# Patient Record
Sex: Female | Born: 1950 | Race: White | Hispanic: No | Smoking: Never smoker
Health system: Southern US, Community
[De-identification: ages and names within clinical notes are randomized; demographics above are authoritative.]

## PROBLEM LIST (undated history)

## (undated) DIAGNOSIS — J189 Pneumonia, unspecified organism: Secondary | ICD-10-CM

## (undated) DIAGNOSIS — I10 Essential (primary) hypertension: Secondary | ICD-10-CM

## (undated) DIAGNOSIS — N393 Stress incontinence (female) (male): Secondary | ICD-10-CM

## (undated) DIAGNOSIS — F32A Depression, unspecified: Secondary | ICD-10-CM

## (undated) DIAGNOSIS — F988 Other specified behavioral and emotional disorders with onset usually occurring in childhood and adolescence: Secondary | ICD-10-CM

## (undated) DIAGNOSIS — R7303 Prediabetes: Secondary | ICD-10-CM

## (undated) DIAGNOSIS — M1990 Unspecified osteoarthritis, unspecified site: Secondary | ICD-10-CM

## (undated) DIAGNOSIS — R55 Syncope and collapse: Principal | ICD-10-CM

## (undated) DIAGNOSIS — N200 Calculus of kidney: Secondary | ICD-10-CM

## (undated) DIAGNOSIS — F329 Major depressive disorder, single episode, unspecified: Secondary | ICD-10-CM

## (undated) DIAGNOSIS — F419 Anxiety disorder, unspecified: Secondary | ICD-10-CM

## (undated) DIAGNOSIS — C539 Malignant neoplasm of cervix uteri, unspecified: Secondary | ICD-10-CM

## (undated) DIAGNOSIS — K219 Gastro-esophageal reflux disease without esophagitis: Secondary | ICD-10-CM

## (undated) DIAGNOSIS — Z889 Allergy status to unspecified drugs, medicaments and biological substances status: Secondary | ICD-10-CM

## (undated) HISTORY — DX: Syncope and collapse: R55

---

## 1989-01-19 DIAGNOSIS — J189 Pneumonia, unspecified organism: Secondary | ICD-10-CM

## 1989-01-19 HISTORY — PX: BREAST BIOPSY: SHX20

## 1989-01-19 HISTORY — DX: Pneumonia, unspecified organism: J18.9

## 1993-05-21 DIAGNOSIS — C539 Malignant neoplasm of cervix uteri, unspecified: Secondary | ICD-10-CM

## 1993-05-21 HISTORY — PX: VAGINAL HYSTERECTOMY: SUR661

## 1993-05-21 HISTORY — DX: Malignant neoplasm of cervix uteri, unspecified: C53.9

## 2000-04-03 ENCOUNTER — Encounter: Payer: Self-pay | Admitting: Gynecology

## 2000-04-03 ENCOUNTER — Encounter: Admission: RE | Admit: 2000-04-03 | Discharge: 2000-04-03 | Payer: Self-pay | Admitting: Gynecology

## 2000-04-18 ENCOUNTER — Encounter: Payer: Self-pay | Admitting: Gynecology

## 2000-04-24 ENCOUNTER — Inpatient Hospital Stay (HOSPITAL_COMMUNITY): Admission: RE | Admit: 2000-04-24 | Discharge: 2000-04-26 | Payer: Self-pay | Admitting: Gynecology

## 2000-07-15 ENCOUNTER — Other Ambulatory Visit: Admission: RE | Admit: 2000-07-15 | Discharge: 2000-07-15 | Payer: Self-pay | Admitting: Gynecology

## 2001-01-07 ENCOUNTER — Other Ambulatory Visit: Admission: RE | Admit: 2001-01-07 | Discharge: 2001-01-07 | Payer: Self-pay | Admitting: Gynecology

## 2001-10-23 ENCOUNTER — Emergency Department (HOSPITAL_COMMUNITY): Admission: EM | Admit: 2001-10-23 | Discharge: 2001-10-23 | Payer: Self-pay | Admitting: Emergency Medicine

## 2001-10-23 ENCOUNTER — Encounter: Payer: Self-pay | Admitting: Emergency Medicine

## 2002-12-02 ENCOUNTER — Other Ambulatory Visit: Admission: RE | Admit: 2002-12-02 | Discharge: 2002-12-02 | Payer: Self-pay | Admitting: Gynecology

## 2004-03-15 ENCOUNTER — Other Ambulatory Visit: Admission: RE | Admit: 2004-03-15 | Discharge: 2004-03-15 | Payer: Self-pay | Admitting: Gynecology

## 2004-04-12 ENCOUNTER — Encounter: Admission: RE | Admit: 2004-04-12 | Discharge: 2004-04-12 | Payer: Self-pay | Admitting: Gynecology

## 2007-10-27 ENCOUNTER — Other Ambulatory Visit: Admission: RE | Admit: 2007-10-27 | Discharge: 2007-10-27 | Payer: Self-pay | Admitting: Internal Medicine

## 2009-05-17 ENCOUNTER — Ambulatory Visit (HOSPITAL_COMMUNITY): Admission: RE | Admit: 2009-05-17 | Discharge: 2009-05-17 | Payer: Self-pay | Admitting: Internal Medicine

## 2010-10-06 NOTE — Discharge Summary (Signed)
[facility]  Patient:    Karina [name], Karina [name]                     MRN: [id number] Adm. Date:  04/24/00 Disc. Date: 04/26/00 Attending:  Gretta Cool, M.D. CC:         [facility]   Discharge Summary  HISTORY OF PRESENT ILLNESS:  Ms. Gomm is a 60 year old white married female, gravida 3, para 2, abortus 1, who presents to the office with reports of increasingly severe urinary incontinence with genuine stress pattern and minimal urgency component.  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  On exam she has anterior vaginal wall descensus and loss of level 2 support with paravaginal defects bilaterally as well as rotational descent of the vesicle neck with incontinence demonstrated in the supine position with coughing.  She has voided approximately 400 cc of urine with residual 15 cc with a positive Q-tip test of greater than 60 degrees. The anal reflexes and sphincter are intact but poor posterior support with a large rectocele and enterocele.  She is extremely symptomatic with pain on prolonged standing and is now admitted for definitive therapy by paravaginal repair plus Burch, posterior enterocele repairs.  PHYSICAL EXAMINATION:  CHEST:  Clear to A&P.  HEART:  Rate and rhythm are regular without murmur, gallop, or cardiac enlargement.  ABDOMEN:  Soft with large panniculus with very high abdomen to hip ratio.  No evidence of organomegaly.  Previous cesarean section scar for second delivery.  PELVIC:  External genitalia within normal limits for female.  Vagina clean and rugose.  Bilateral paravaginal defects with a large cystocele, as noted. There is marked rotational descent of the vesicle neck with 30 degrees positive Q-tip test.   She has a large enterocele with transverse fascial defect at the apex of the cuff.  There is a large rectocele with detachment of the peroneal body from the perirectal fascia.  Cervix and uterus are surgically absent.  The cuff seems very well supported and does not descend with straining.  Adnexa nonpalpable.  Rectovaginal exam confirms.  IMPRESSION: 1. Grade 3 cystocele with bilateral paravaginal defects and rotational descent    of the vesicle neck with incontinence requiring pads. 2. Rectocele and enterocele, symptomatic, with pelvic pressure on standing,    grade 3. 3. History of cold knife conization and vaginal hysterectomy for microinvasive    squamous cell carcinoma of the cervix without recurrent disease. 4. Hypertension, controlled on angiotensin-converting enzyme inhibitors. 5. Obesity. 6. Family history of diabetes. 7. History of breast cancer.  PLAN:  She is now admitted for definitive therapy by paravaginal repair plus Burch procedures, posterior and enterocele repairs under general anesthesia. The risks and benefits have been discussed with the patient, and she accepts these procedures.  LABORATORY DATA:  Admission hemoglobin 14.4, hematocrit 41.6.  BUN 13, creatinine 0.7.  No evidence of acute disease on chest x-ray.  EKG:  Normal sinus rhythm.  HOSPITAL COURSE:  The patient underwent the above-named procedures without complications.  She was returned to the recovery room in excellent condition. Estimated blood loss was less than 300 cc, no replacement required.  Her postoperative course was without complications, and she was discharged on the second postoperative day in excellent condition.  DISCHARGE INSTRUCTIONS:  No heavy  lifting or straining.  No vaginal entrance. Increased ambulation as tolerated.  She is to call for any fever of over 100.1 or failure of daily improvement.  Diet is regular.  MEDICATIONS: 1. Tylox 1 p.o. q.4h. p.r.n. discomfort. 2.  Vioxx 25 mg 1 p.o. q.d.  Suprapubic catheter was removed prior to discharge.  FOLLOW-UP:  She is to return to the office in one week for follow-up.  CONDITION ON DISCHARGE:  Excellent.  DISCHARGE DIAGNOSES: 1. Genuine stress incontinence with grade 3 cystocele and bilateral    paravaginal defects. 2. Symptomatic enterocele and rectocele grade 3. 3. History of cold knife conization and vaginal hysterectomy for microinvasive    carcinoma of the cervix in 1995.  PROCEDURES:  Paravaginal repair plus Burch procedure for stress incontinence, cystocele, rectocele, and enterocele repairs under general anesthesia. DD:  05/20/00 TD:  05/20/00 Job: [id number]

## 2010-10-06 NOTE — H&P (Signed)
[facility]  Patient:    Karina [name], Karina [name]                     MRN: [id number] Attending:  Gretta Cool, M.D. CC:         [facility]   History and Physical  CHIEF COMPLAINT:  Incontinence of urine and symptomatic pelvic organ prolapse.  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  She has had increasingly severe incontinence of urine with genuine stress incontinence pattern and minimal urgency component.  On exam, she has had increasingly severe anterior vaginal wall descensus and loss of level 2 support with paravaginal defects bilaterally; she also has rotational descent of the vesicle neck with incontinence demonstrated in supine position with coughing, two out of three successive coughs.  She has a voided volume of 400 cc, residual urine of 15 cc, positive Q-tip test with rotation of greater than 60 degrees.  She has intact anal reflexes and sphincter, but she has very poor posterior support with large enterocele and rectocele.  She is extremely symptomatic with pain on prolonged standing.  She is now admitted for definitive therapy by paravaginal repair plus Burch, posterior and enterocele repairs.  She understands the alternative forms of therapy including sling procedure and conservative management, which she has tried and failed.  She also understands the possible prolonged return to comfortable coitus and the need for vaginal estrogen to maintain her fascial integrity as she passes through menopause.  She is now admitted for definitive therapy.  PAST MEDICAL HISTORY:  Usual childhood diseases without sequelae.  Medical illnesses:  Hypertension, on therapy with Lotensin  20/25 mg, a half tablet daily; Estrace vaginal cream; Nasonex inhaler for allergies.  ALLERGIES:  She is allergic to PENICILLIN with rash, remote.  No other known significant allergy.  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  FAMILY HISTORY:  Father is living with diabetes, adult-onset type 2.  Mother is living and well.  Two maternal aunts had breast disease.  Maternal grandmother died of breast cancer.  SOCIAL HISTORY:  Patient is a Museum/gallery curator, recently remarried.  Two children, one living at home.  REVIEW OF SYSTEMS:  HEENT:  Denies symptoms.  CARDIORESPIRATORY:  Denies asthma, cough, bronchitis or shortness of breath.  GI/GU:  Denies frequency, urgency, dysuria.  Incontinence as above with genuine stress.  PHYSICAL EXAMINATION  GENERAL:  Well-developed but rather significantly over ideal weight at 5 feet 1 inch in height and 184 pounds.  HEENT:  Pupils equal and react to light and accommodation.  Fundi benign. Oropharynx clear.  NECK:  Supple without mass or thyroid enlargement.  CHEST:  Clear P to A.  HEART:  Regular rhythm, without murmur or cardiac enlargement.  ABDOMEN:  Soft with large panniculus with very high abdomen-to-hip ratio.  No evidence of organomegaly.  Previous cesarean section scar for second delivery.  PELVIC:  External genitalia:  Normal female.  Vagina:  Adequate estrogen effect.  There are bilateral paravaginal defects with large cystocele.  There is also marked rotational descent of the vesical neck with a 30 degree positive Q-tip test.  I am unable to identify a significant central  fascial defect though even with reduction of the paravaginal defects.  She has a very large enterocele with transverse fascial defect at the apex of the cuff.  She also has a large rectocele with detachment of the perineal body from the perirectal  fascia.  Cervix and uterus are surgically absent.  The cuff seems very well-supported and does not descend with straining.  Adnexa nonpalpable. Rectovaginal exam confirms.  EXTREMITIES:  Negative.  NEUROLOGIC:  Physiologic.  IMPRESSIONS 1. Grade 3 cystocele with bilateral paravaginal defects and rotational descent    of the vesical neck with incontinence requiring pads. 2. Rectocele and enterocele, symptomatic with pelvic pressure on standing,    grade 3. 3. History of cold knife conization and vaginal hysterectomy for microinvasive    squamous cell carcinoma of the cervix with no recurrent disease. 4. Hypertension, controlled on ACE inhibitors. 5. Obesity. 6. Family history of diabetes, first degree relatives. 7. Family history of breast cancer, second degree relatives. DD:  04/24/00 TD:  04/25/00 Job: [id number]

## 2010-10-06 NOTE — Op Note (Signed)
[facility]  Patient:    Karina [name], Karina [name]               MRN: [id number] Proc. Date: 04/24/00 Adm. Date:  [id number] Attending:  Katrina Stack CC:         [facility]   Operative Report  PREOPERATIVE DIAGNOSES: 1. Genuine stress incontinence with a grade 3 cystocele and bilateral    paravaginal defects. 2. Symptomatic enterocele and rectocele, grade 3. 3. History of cold knife conization and vaginal hysterectomy for    microinvasive carcinoma of the cervix, 1995.  POSTOPERATIVE DIAGNOSIS: 1. Genuine stress incontinence with a grade 3 cystocele and bilateral    paravaginal defects. 2. Symptomatic enterocele and rectocele, grade 3. 3. History of cold knife conization and vaginal hysterectomy for    microinvasive carcinoma of the cervix, 1995.  OPERATION:  Paravaginal repair plus Burch procedure for stress incontinence and cystocele, rectocele, and enterocele repairs.  SURGEON:  Gretta Cool, M.D.  ASSISTANT:  Raynald Kemp, M.D.  ANESTHESIA:  General endotracheal.  DESCRIPTION OF PROCEDURE:  Under excellent anesthesia, with patient prepped and draped in Allen stirrups, with her bladder drained by a three-way Foley catheter, a Pfannenstiel incision was made and extended through the fascia. The rectus muscles were then separated in the midline and attention turned to the rectopubic space.  The retropubic space was dissected and the fatty adventitial tissue removed from the paravaginal fascia.  The landmarks were identified and the paravaginal repair undertaken beginning at the apex just below the attachment of the white line fascia of the pelvis to the ischial spine.  The paravaginal defect was then secured and a series of approximately six sutures approximately a centimeter apart on each side from the centimeter below the ischial spine to the symphysis.  Once the paravaginal sutures were placed, further using  interrupted sutures of 0 Ethibond sutures were placed for the Burch procedure as far lateral as possible so as to support the vesicle neck into a high intraabdominal position.  Ethibond O was again used with two sutures placed on each side.  The procedure was exceedingly difficult because of this patients obesity and the depth of the paravaginal tissues. Bleeding was well controlled and at this point the retropubic space was irrigated and the closure of the abdominal wall undertaken.  The rectus muscles were plicated in the midline with a running suture of 0 Monocryl.  The fascia was then approximated with a running suture of 0 Vicryl from each angle of the midline.  The subcutaneous tissue was approximated with interrupted sutures, 3-0 Vicryl and the skin closed with skin staples and Steri-Strips.  Attention was turned to the vaginal portion of the procedure.  The patient was repositioned and the vaginal procedure begun by incision of the mucosa at the perineal body.  The mucosa was then undermined from the introitus to the apex of vaginal cuff.  The paravaginal tissues were injected prior to dissection with Xylocaine 1% with epinephrine to improve tissue planes and conserve blood loss.  At the apex of the dissection of the cuff, the very large enterocele could be seen bulging through the fascial defect.  The large transverse fascial defect was identified extending from the one angle to the other of the apex of the vaginal cuff with a large 6 cm bulge of fatty tissue through the defect.  The enterocele sac was removed and plicated and the enterocele entirely reduced.  The uterosacral ligaments were then identified  on each side and plication of the uterosacral cardinal complex undertaken with 0 Ethibond as far posteriorly as possible so as to secure the apex of the cuff.  The fascial defect that had been identified was then secured to the uterosacral cardinal complex plication and to the  apex of the vaginal cuff fascia.  A running suture of 0 Vicryl was used.  The upper layers of endopelvic fascia were then plicated with a running suture of 0 Vicryl from the apex of the cuff to the introitus.  Great care was taken to plicate the levator fascia and to reattach the perineal body fascia to the levator fascia.  At this point the mucosa was trimmed and the vaginal mucosa closed with a running subcuticular closure of 2-0 Vicryl including the upper layers of endopelvic fascia.  At the end of the procedure, the vaginal remained adequate by digital caliber.  The The vaginal length was well maintained.  At this point the bleeding was well controlled.  The bladder was filled with approximately 400 cc of irrigation fluid and Bonnano suprapubic Cystocath placed.  The [place] suprapubic Cystocath was then secured with interrupted sutures of 3-0 nylon.  At the end of the procedure, sponge and lap counts were correct.  There were no complications.  Estimated blood loss is less than 300 cc.  Replacement, none. DD:  04/24/00 TD:  04/25/00 Job: [id number]

## 2011-03-28 ENCOUNTER — Other Ambulatory Visit (HOSPITAL_COMMUNITY): Payer: Self-pay | Admitting: Physician Assistant

## 2011-03-28 DIAGNOSIS — Z139 Encounter for screening, unspecified: Secondary | ICD-10-CM

## 2011-04-02 ENCOUNTER — Other Ambulatory Visit: Payer: Self-pay | Admitting: Obstetrics and Gynecology

## 2011-04-02 DIAGNOSIS — Z139 Encounter for screening, unspecified: Secondary | ICD-10-CM

## 2011-04-03 ENCOUNTER — Ambulatory Visit (HOSPITAL_COMMUNITY)
Admission: RE | Admit: 2011-04-03 | Discharge: 2011-04-03 | Disposition: A | Discharge status: Home / Self Care | Payer: Self-pay | Source: Ambulatory Visit | Attending: Physician Assistant | Admitting: Physician Assistant

## 2011-04-03 DIAGNOSIS — Z139 Encounter for screening, unspecified: Secondary | ICD-10-CM

## 2011-09-19 DIAGNOSIS — R55 Syncope and collapse: Secondary | ICD-10-CM

## 2011-09-19 HISTORY — DX: Syncope and collapse: R55

## 2011-10-04 ENCOUNTER — Emergency Department (HOSPITAL_COMMUNITY): Payer: No Typology Code available for payment source

## 2011-10-04 ENCOUNTER — Encounter (HOSPITAL_COMMUNITY): Payer: Self-pay | Admitting: *Deleted

## 2011-10-04 ENCOUNTER — Observation Stay (HOSPITAL_COMMUNITY)
Admission: EM | Admit: 2011-10-04 | Discharge: 2011-10-05 | Disposition: A | Discharge status: Home / Self Care | Payer: No Typology Code available for payment source | Source: Ambulatory Visit | Attending: Cardiology | Admitting: Cardiology

## 2011-10-04 DIAGNOSIS — R55 Syncope and collapse: Principal | ICD-10-CM | POA: Diagnosis present

## 2011-10-04 DIAGNOSIS — R0989 Other specified symptoms and signs involving the circulatory and respiratory systems: Secondary | ICD-10-CM | POA: Insufficient documentation

## 2011-10-04 DIAGNOSIS — F3289 Other specified depressive episodes: Secondary | ICD-10-CM | POA: Insufficient documentation

## 2011-10-04 DIAGNOSIS — N393 Stress incontinence (female) (male): Secondary | ICD-10-CM

## 2011-10-04 DIAGNOSIS — R079 Chest pain, unspecified: Secondary | ICD-10-CM | POA: Insufficient documentation

## 2011-10-04 DIAGNOSIS — R0609 Other forms of dyspnea: Secondary | ICD-10-CM | POA: Insufficient documentation

## 2011-10-04 DIAGNOSIS — I10 Essential (primary) hypertension: Secondary | ICD-10-CM | POA: Diagnosis present

## 2011-10-04 DIAGNOSIS — Z8541 Personal history of malignant neoplasm of cervix uteri: Secondary | ICD-10-CM | POA: Insufficient documentation

## 2011-10-04 DIAGNOSIS — F32A Depression, unspecified: Secondary | ICD-10-CM | POA: Diagnosis present

## 2011-10-04 DIAGNOSIS — R42 Dizziness and giddiness: Secondary | ICD-10-CM | POA: Insufficient documentation

## 2011-10-04 DIAGNOSIS — Y929 Unspecified place or not applicable: Secondary | ICD-10-CM | POA: Insufficient documentation

## 2011-10-04 DIAGNOSIS — F329 Major depressive disorder, single episode, unspecified: Secondary | ICD-10-CM | POA: Diagnosis present

## 2011-10-04 HISTORY — DX: Essential (primary) hypertension: I10

## 2011-10-04 HISTORY — DX: Major depressive disorder, single episode, unspecified: F32.9

## 2011-10-04 HISTORY — DX: Pneumonia, unspecified organism: J18.9

## 2011-10-04 HISTORY — DX: Stress incontinence (female) (male): N39.3

## 2011-10-04 HISTORY — DX: Other specified behavioral and emotional disorders with onset usually occurring in childhood and adolescence: F98.8

## 2011-10-04 HISTORY — DX: Allergy status to unspecified drugs, medicaments and biological substances: Z88.9

## 2011-10-04 HISTORY — DX: Unspecified osteoarthritis, unspecified site: M19.90

## 2011-10-04 HISTORY — DX: Depression, unspecified: F32.A

## 2011-10-04 HISTORY — DX: Prediabetes: R73.03

## 2011-10-04 HISTORY — DX: Calculus of kidney: N20.0

## 2011-10-04 HISTORY — DX: Gastro-esophageal reflux disease without esophagitis: K21.9

## 2011-10-04 HISTORY — DX: Malignant neoplasm of cervix uteri, unspecified: C53.9

## 2011-10-04 HISTORY — DX: Anxiety disorder, unspecified: F41.9

## 2011-10-04 HISTORY — DX: Syncope and collapse: R55

## 2011-10-04 LAB — COMPREHENSIVE METABOLIC PANEL
ALT: 28 U/L (ref 0–35)
Albumin: 3.9 g/dL (ref 3.5–5.2)
Alkaline Phosphatase: 81 U/L (ref 39–117)
BUN: 13 mg/dL (ref 6–23)
Calcium: 9.6 mg/dL (ref 8.4–10.5)
Potassium: 3.8 mEq/L (ref 3.5–5.1)
Sodium: 141 mEq/L (ref 135–145)
Total Protein: 7 g/dL (ref 6.0–8.3)

## 2011-10-04 LAB — DIFFERENTIAL
Basophils Relative: 0 % (ref 0–1)
Eosinophils Absolute: 0.2 10*3/uL (ref 0.0–0.7)
Eosinophils Relative: 2 % (ref 0–5)
Neutrophils Relative %: 64 % (ref 43–77)

## 2011-10-04 LAB — URINE MICROSCOPIC-ADD ON

## 2011-10-04 LAB — URINALYSIS, ROUTINE W REFLEX MICROSCOPIC
Bilirubin Urine: NEGATIVE
Nitrite: NEGATIVE
Specific Gravity, Urine: 1.018 (ref 1.005–1.030)
Urobilinogen, UA: 0.2 mg/dL (ref 0.0–1.0)
pH: 6.5 (ref 5.0–8.0)

## 2011-10-04 LAB — CBC
MCH: 30.6 pg (ref 26.0–34.0)
MCHC: 33.9 g/dL (ref 30.0–36.0)
MCV: 90.1 fL (ref 78.0–100.0)
Platelets: 221 10*3/uL (ref 150–400)

## 2011-10-04 LAB — TROPONIN I: Troponin I: <0.3 ng/mL (ref <0.30)

## 2011-10-04 LAB — CARDIAC PANEL(CRET KIN+CKTOT+MB+TROPI)
CK, MB: 3.8 ng/mL (ref 0.3–4.0)
Total CK: 144 U/L (ref 7–177)

## 2011-10-04 MED ORDER — LISINOPRIL 20 MG PO TABS
20.0000 mg | ORAL_TABLET | Freq: Every day | ORAL | Status: DC
  Administered 2011-10-05: 20 mg via ORAL
  Filled 2011-10-04 (×2): qty 1

## 2011-10-04 MED ORDER — ADULT MULTIVITAMIN W/MINERALS CH
1.0000 | ORAL_TABLET | Freq: Every day | ORAL | Status: DC
  Administered 2011-10-05: 1 via ORAL
  Filled 2011-10-04: qty 1

## 2011-10-04 MED ORDER — ACETAMINOPHEN 650 MG RE SUPP: 650.0000 mg | Freq: Four times a day (QID) | RECTAL | Status: DC | PRN

## 2011-10-04 MED ORDER — ONDANSETRON HCL 4 MG/2ML IJ SOLN: 4.0000 mg | Freq: Four times a day (QID) | INTRAMUSCULAR | Status: DC | PRN

## 2011-10-04 MED ORDER — ACETAMINOPHEN 325 MG PO TABS: 650.0000 mg | ORAL_TABLET | Freq: Four times a day (QID) | ORAL | Status: DC | PRN

## 2011-10-04 MED ORDER — ENOXAPARIN SODIUM 40 MG/0.4ML ~~LOC~~ SOLN
40.0000 mg | SUBCUTANEOUS | Status: DC
  Administered 2011-10-04: 40 mg via SUBCUTANEOUS
  Filled 2011-10-04 (×2): qty 0.4

## 2011-10-04 MED ORDER — SODIUM CHLORIDE 0.9 % IV SOLN
INTRAVENOUS | Status: AC
  Administered 2011-10-04: via INTRAVENOUS

## 2011-10-04 MED ORDER — ONDANSETRON HCL 4 MG PO TABS: 4.0000 mg | ORAL_TABLET | Freq: Four times a day (QID) | ORAL | Status: DC | PRN

## 2011-10-04 MED ORDER — SODIUM CHLORIDE 0.9 % IJ SOLN
3.0000 mL | Freq: Two times a day (BID) | INTRAMUSCULAR | Status: DC
  Administered 2011-10-04: 3 mL via INTRAVENOUS

## 2011-10-04 MED ORDER — CITALOPRAM HYDROBROMIDE 20 MG PO TABS
20.0000 mg | ORAL_TABLET | Freq: Every day | ORAL | Status: DC
  Administered 2011-10-05: 20 mg via ORAL
  Filled 2011-10-04: qty 1

## 2011-10-04 MED ORDER — LISINOPRIL-HYDROCHLOROTHIAZIDE 20-25 MG PO TABS: 1.0000 | ORAL_TABLET | Freq: Every day | ORAL | Status: DC

## 2011-10-04 MED ORDER — HYDROCHLOROTHIAZIDE 25 MG PO TABS
25.0000 mg | ORAL_TABLET | Freq: Every day | ORAL | Status: DC
  Administered 2011-10-05: 25 mg via ORAL
  Filled 2011-10-04: qty 1

## 2011-10-04 MED ORDER — METOPROLOL TARTRATE 50 MG PO TABS
50.0000 mg | ORAL_TABLET | Freq: Two times a day (BID) | ORAL | Status: DC
  Administered 2011-10-04 – 2011-10-05 (×2): 50 mg via ORAL
  Filled 2011-10-04 (×3): qty 1

## 2011-10-04 MED ORDER — ASPIRIN EC 81 MG PO TBEC
81.0000 mg | DELAYED_RELEASE_TABLET | Freq: Every day | ORAL | Status: DC
  Administered 2011-10-05: 81 mg via ORAL
  Filled 2011-10-04: qty 1

## 2011-10-04 MED ORDER — OMEGA-3-ACID ETHYL ESTERS 1 G PO CAPS
2.0000 g | ORAL_CAPSULE | Freq: Two times a day (BID) | ORAL | Status: DC
  Administered 2011-10-04 – 2011-10-05 (×2): 2 g via ORAL
  Filled 2011-10-04 (×3): qty 2
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
## 2011-10-04 NOTE — Progress Notes (Signed)
Spoke with [facility] PA concerning needing neuro checks on pt. She stated Dr. Patty Sermons was ruling out cardiac, no need for neuro workup
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
## 2011-10-04 NOTE — ED Notes (Signed)
Difficulty without ambulation to restroom

## 2011-10-04 NOTE — ED Notes (Signed)
Lab at bedside

## 2011-10-04 NOTE — ED Notes (Signed)
Admitting at bedside 
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
## 2011-10-04 NOTE — Progress Notes (Signed)
CRITICAL VALUE ALERT  Critical value received:  CKMB 7.5  Date of notification:  10/04/11  Time of notification:  2254  Critical value read back:yes  Nurse who received alert:  Harless Litten, RN  MD notified (1st page):  [place] on-call (Dr. Tresa Endo)  Time of first page:  2255  Responding MD:  Dr. Tresa Endo  Time MD responded:  2258

## 2011-10-04 NOTE — ED Notes (Signed)
Attempted report x 1, will call back in 5 

## 2011-10-05 ENCOUNTER — Encounter (HOSPITAL_COMMUNITY): Payer: Self-pay | Admitting: Nurse Practitioner

## 2011-10-05 ENCOUNTER — Encounter (INDEPENDENT_AMBULATORY_CARE_PROVIDER_SITE_OTHER): Payer: Self-pay

## 2011-10-05 DIAGNOSIS — R55 Syncope and collapse: Secondary | ICD-10-CM

## 2011-10-05 LAB — LIPID PANEL
LDL Cholesterol: 99 mg/dL (ref 0–99)
Triglycerides: 189 mg/dL — ABNORMAL HIGH (ref <150)
VLDL: 38 mg/dL (ref 0–40)

## 2011-10-05 LAB — TSH: TSH: 1.101 u[IU]/mL (ref 0.350–4.500)

## 2011-10-05 LAB — CARDIAC PANEL(CRET KIN+CKTOT+MB+TROPI)
CK, MB: 9.5 ng/mL (ref 0.3–4.0)
Relative Index: 3.4 — ABNORMAL HIGH (ref 0.0–2.5)
Total CK: 279 U/L — ABNORMAL HIGH (ref 7–177)
Troponin I: <0.3 ng/mL (ref <0.30)

## 2011-10-05 LAB — BASIC METABOLIC PANEL
Chloride: 100 mEq/L (ref 96–112)
Creatinine, Ser: 0.75 mg/dL (ref 0.50–1.10)
GFR calc Af Amer: >90 mL/min (ref >90)
Potassium: 3.6 mEq/L (ref 3.5–5.1)

## 2011-10-05 LAB — HEMOGLOBIN A1C: Hgb A1c MFr Bld: 6.7 % — ABNORMAL HIGH (ref <5.7)

## 2011-10-05 MED ORDER — LISINOPRIL-HYDROCHLOROTHIAZIDE 20-12.5 MG PO TABS: 2.0000 | ORAL_TABLET | Freq: Every day | ORAL | Status: DC
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
## 2011-10-05 NOTE — Discharge Summary (Signed)
See progress notes Karina [name]  

## 2011-10-05 NOTE — Clinical Social Work Psychosocial (Signed)
     Clinical Social Work Department BRIEF PSYCHOSOCIAL ASSESSMENT 10/05/2011  Patient:  Karina [name], Karina [name]     Account Number:  [id number]     Admit date:  10/04/2011  Clinical Social Worker:  Doree Albee  Date/Time:  10/05/2011 10:57 AM  Referred by:  RN  Date Referred:  10/05/2011 Referred for  Advanced Directives   Other Referral:   Interview type:  Patient Other interview type:    PSYCHOSOCIAL DATA Living Status:  FAMILY Admitted from facility:   Level of care:   Primary support name:  Jeralyn Ruths Primary support relationship to patient:  CHILD, ADULT Degree of support available:   strong    CURRENT CONCERNS Current Concerns  Other - See comment   Other Concerns:   advanced directives    SOCIAL WORK ASSESSMENT / PLAN CSW met with pt at bedside to discuss advanced directives. Pt mother was present for discussion, as permitted by pt. Pt and CSW discussed the role of advanced directives with living will and health care power of attorney. Pt is interested in completing in the hosptial. CSW will notify notary to assist with completion of patient advanced directive when pt is ready. Pt plans to appointment pt daughter as [place].   Assessment/plan status:  Psychosocial Support/Ongoing Assessment of Needs Other assessment/ plan:   continue to assist with advanced directive   Information/referral to community resources:   advanced directive    PATIENTS/FAMILYS RESPONSE TO PLAN OF CARE: pt appreciated csw concern and support. Pt is motivated to complete advanced directive to assit with pt needs if ever unable to communicate needs for pt self.

## 2011-10-05 NOTE — Discharge Instructions (Signed)
***  PLEASE REMEMBER TO BRING ALL OF YOUR MEDICATIONS TO EACH OF YOUR FOLLOW-UP OFFICE VISITS.  **NO DRIVING FOR AT LEAST 3 MONTHS OR UNTIL INSTRUCTED OTHERWISE**

## 2011-10-05 NOTE — Progress Notes (Signed)
  Echocardiogram 2D Echocardiogram has been performed.  Karina [name] F 10/05/2011, 10:09 AM

## 2011-10-05 NOTE — Progress Notes (Addendum)
Pt elected to complete Living Will of Advanced Directive. Living will notarized and copy in patient chart. CSW provided pt with original advanced directive packet. Marland KitchenPt plans to follow up with community notary to complete health care power of attorney when patient is able to reach pt children regarding addresses to complete health care power of attorney. No further Clinical Social Work needs, signing off.   Catha Gosselin, Theresia Majors  [phone] .10/05/2011 11:52am

## 2011-10-05 NOTE — Progress Notes (Signed)
@   Subjective:  Denies CP or dyspnea; no dizziness   Objective:  Filed Vitals:   10/04/11 1646 10/04/11 1742 10/04/11 2100 10/05/11 0500  BP: 164/78 144/90 127/80 135/79  Pulse: 66 64 73 63  Temp: 98.9 F (37.2 C) 98.9 F (37.2 C) 98.1 F (36.7 C) 98.5 F (36.9 C)  TempSrc: Oral Oral Oral Oral  Resp: 16 18 18 18   Height:  5\' 2"  (1.575 m)    Weight:  90.719 kg (200 lb)    SpO2: 98% 97% 95% 96%    Physical Exam: Physical exam: Well-developed well-nourished in no acute distress.  Skin is warm and dry.  HEENT is normal.  Neck is supple.  Chest is clear to auscultation with normal expansion.  Cardiovascular exam is regular rate and rhythm.  Abdominal exam nontender or distended. No masses palpated. Extremities show no edema. neuro grossly intact    Lab Results: Basic Metabolic Panel:  Basename 10/05/11 0331 10/04/11 1601 10/04/11 1232  NA 137 -- 141  K 3.6 -- 3.8  CL 100 -- 104  CO2 26 -- 27  GLUCOSE 103* -- 115*  BUN 13 -- 13  CREATININE 0.75 -- 0.64  CALCIUM 9.0 -- 9.6  MG -- 1.8 --  PHOS -- -- --   CBC:  Basename 10/04/11 1232  WBC 9.0  NEUTROABS 5.8  HGB 15.4*  HCT 45.4  MCV 90.1  PLT 221   Cardiac Enzymes:  Basename 10/05/11 0331 10/04/11 2200 10/04/11 1601  CKTOTAL 279* 215* 144  CKMB 9.5* 7.5* 3.8  CKMBINDEX -- -- --  TROPONINI <0.30 <0.30 <0.30     Assessment/Plan:  1) Syncope - etiology unclear; troponins neg; ECG no ST changes and normal intervals; telemetry no arrythmia. Plan echo today; if normal LV function, DC today with outpatient stress echo to exclude ischemia and event monitor. FU with Dr Patty Sermons 2-4 weeks. No driving for at least 3 months. 2) Hypertension - BP improved; continue present BP meds and adjust as outpatient. > 30 min PA and physician time  D2  Olga Millers 10/05/2011, 8:16 AM

## 2011-10-16 ENCOUNTER — Other Ambulatory Visit (HOSPITAL_COMMUNITY): Payer: Self-pay | Admitting: Radiology

## 2011-10-16 DIAGNOSIS — R55 Syncope and collapse: Secondary | ICD-10-CM

## 2011-10-17 ENCOUNTER — Ambulatory Visit (HOSPITAL_COMMUNITY): Payer: Self-pay | Attending: Cardiology

## 2011-10-17 ENCOUNTER — Encounter: Payer: Self-pay | Admitting: Cardiology

## 2011-10-17 DIAGNOSIS — I10 Essential (primary) hypertension: Secondary | ICD-10-CM | POA: Insufficient documentation

## 2011-10-17 DIAGNOSIS — R000 Tachycardia, unspecified: Secondary | ICD-10-CM | POA: Insufficient documentation

## 2011-10-17 DIAGNOSIS — R55 Syncope and collapse: Secondary | ICD-10-CM | POA: Insufficient documentation

## 2011-10-17 DIAGNOSIS — Z8249 Family history of ischemic heart disease and other diseases of the circulatory system: Secondary | ICD-10-CM | POA: Insufficient documentation

## 2011-10-17 DIAGNOSIS — R42 Dizziness and giddiness: Secondary | ICD-10-CM | POA: Insufficient documentation

## 2011-10-17 DIAGNOSIS — R0989 Other specified symptoms and signs involving the circulatory and respiratory systems: Secondary | ICD-10-CM | POA: Insufficient documentation

## 2011-10-17 DIAGNOSIS — R0609 Other forms of dyspnea: Secondary | ICD-10-CM | POA: Insufficient documentation

## 2011-10-17 DIAGNOSIS — R072 Precordial pain: Secondary | ICD-10-CM | POA: Insufficient documentation

## 2011-10-17 DIAGNOSIS — E785 Hyperlipidemia, unspecified: Secondary | ICD-10-CM | POA: Insufficient documentation

## 2011-10-17 DIAGNOSIS — E669 Obesity, unspecified: Secondary | ICD-10-CM | POA: Insufficient documentation

## 2011-10-19 ENCOUNTER — Encounter: Payer: Self-pay | Admitting: Nurse Practitioner

## 2011-10-19 ENCOUNTER — Ambulatory Visit (INDEPENDENT_AMBULATORY_CARE_PROVIDER_SITE_OTHER): Payer: Self-pay | Admitting: Nurse Practitioner

## 2011-10-19 VITALS — BP 158/84 | HR 65 | Ht 62.0 in | Wt 208.4 lb

## 2011-10-19 DIAGNOSIS — R55 Syncope and collapse: Secondary | ICD-10-CM

## 2011-10-19 DIAGNOSIS — I10 Essential (primary) hypertension: Secondary | ICD-10-CM

## 2011-10-19 LAB — BASIC METABOLIC PANEL
BUN: 17 mg/dL (ref 6–23)
CO2: 28 mEq/L (ref 19–32)
Calcium: 9.2 mg/dL (ref 8.4–10.5)
Chloride: 105 mEq/L (ref 96–112)
Creatinine, Ser: 0.7 mg/dL (ref 0.4–1.2)
GFR: 87.74 mL/min (ref >60.00)
Glucose, Bld: 95 mg/dL (ref 70–99)
Potassium: 3.6 mEq/L (ref 3.5–5.1)
Sodium: 141 mEq/L (ref 135–145)
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
## 2011-10-19 NOTE — Patient Instructions (Signed)
Your blood pressure looks better.  Stay on your current medicines  Keep wearing your monitor  No driving and no mowing but you may return to work  We will see you in 2 months.  Call the [facility] office at [phone] if you have any questions, problems or concerns.

## 2011-10-19 NOTE — Assessment & Plan Note (Signed)
Karina [name] is doing well. No further spells. Karina [name] will continue to wear her event monitor for another week. If Karina [name] were to have recurrence, would favor loop recorder implant. Karina [name] is not driving for 3 months. Karina [name] will see Dr. Patty Sermons in 2 months. Karina [name] may return to work. Patient is agreeable to this plan and will call if any problems develop in the interim.

## 2011-10-19 NOTE — Assessment & Plan Note (Signed)
Blood pressure is great at home. We will recheck a BMET today. Diet and exercise are encouraged as well as cutting out her soda intake.

## 2011-10-26 ENCOUNTER — Telehealth: Payer: Self-pay | Admitting: Nurse Practitioner

## 2011-10-26 NOTE — Telephone Encounter (Signed)
New msg Pt wants to talk to you about monitor. She did not wear monitor sat-tues because she got another monitor on Monday Please call her back

## 2011-12-21 ENCOUNTER — Ambulatory Visit: Payer: Self-pay | Admitting: Cardiology

## 2011-12-28 ENCOUNTER — Encounter: Payer: Self-pay | Admitting: Cardiology

## 2011-12-28 ENCOUNTER — Ambulatory Visit (INDEPENDENT_AMBULATORY_CARE_PROVIDER_SITE_OTHER): Payer: Self-pay | Admitting: Cardiology

## 2011-12-28 VITALS — BP 122/78 | HR 66 | Ht 62.0 in | Wt 201.0 lb

## 2011-12-28 DIAGNOSIS — I119 Hypertensive heart disease without heart failure: Secondary | ICD-10-CM

## 2011-12-28 DIAGNOSIS — R55 Syncope and collapse: Secondary | ICD-10-CM

## 2011-12-28 DIAGNOSIS — F329 Major depressive disorder, single episode, unspecified: Secondary | ICD-10-CM

## 2011-12-28 DIAGNOSIS — I10 Essential (primary) hypertension: Secondary | ICD-10-CM

## 2011-12-28 NOTE — Assessment & Plan Note (Signed)
The patient has had no recurrent dizziness or syncope

## 2011-12-28 NOTE — Assessment & Plan Note (Signed)
Patient has a long history of depression and has been on citalopram.  Her symptoms of depression have improved.

## 2011-12-28 NOTE — Progress Notes (Signed)
Karina [name] Date of Birth:  [birth year] [facility] [address] [place], [place]  [address] [phone]         Fax   [phone]  History of Present Illness: This pleasant 61 year old woman is seen back for a followup office visit.  She has a past history of essential hypertension.  On May 16 she had an episode of syncope.  She was hospitalized overnight at [facility].  Telemetry was unremarkable.  She was discharged the next day.  She had a subsequent stress echocardiogram in our office which showed no ischemia and her ejection fraction was normal.  She also wore a three-week event monitor which showed no arrhythmias.  Since May 16 she has had no recurrent symptoms of dizziness or syncope.  She's having no chest pain or shortness of breath.  She's had no awareness of palpitations.  Current Outpatient Prescriptions  Medication Sig Dispense Refill  . albuterol (PROVENTIL HFA;VENTOLIN HFA) 108 (90 BASE) MCG/ACT inhaler Inhale 2 puffs into the lungs every 6 (six) hours as needed. For wheezing associated with allergies.      Marland Kitchen aspirin EC 81 MG tablet Take 81 mg by mouth daily.      . citalopram (CELEXA) 20 MG tablet Take 20 mg by mouth daily.      Marland Kitchen lisinopril-hydrochlorothiazide (PRINZIDE,ZESTORETIC) 20-12.5 MG per tablet Take 2 tablets by mouth daily.  180 tablet  3  . metoprolol (LOPRESSOR) 100 MG tablet Take 100 mg by mouth 2 (two) times daily.      . Multiple Vitamin (MULITIVITAMIN WITH MINERALS) TABS Take 1 tablet by mouth daily.      Marland Kitchen omega-3 acid ethyl esters (LOVAZA) 1 G capsule Take 2 g by mouth 2 (two) times daily.      . Pravastatin Sodium (PRAVACHOL PO) Take 20 mg by mouth daily.         Allergies  Allergen Reactions  . Penicillins Rash  . Sulfa Antibiotics Rash    Patient Active Problem List  Diagnosis  . Hypertension  . Syncope  . History of cervical cancer  . Genuine stress incontinence, female  . Depression    History    Smoking status  . Never Smoker   Smokeless tobacco  . Never Used    History  Alcohol Use  . Yes    10/04/11 "mixed drinks <2 X/yr"    Family History  Problem Relation Age of Onset  . Diabetes type II Father   . Coronary artery disease Father   . Hypertension Father   . Hyperlipidemia Father   . Breast cancer Maternal Grandmother   . Breast cancer Maternal Aunt     2 maternal aunts  . Diabetes type II Mother   . Hypertension Mother     Review of Systems: Constitutional: no fever chills diaphoresis or fatigue or change in weight.  Head and neck: no hearing loss, no epistaxis, no photophobia or visual disturbance. Respiratory: No cough, shortness of breath or wheezing. Cardiovascular: No chest pain peripheral edema, palpitations. Gastrointestinal: No abdominal distention, no abdominal pain, no change in bowel habits hematochezia or melena. Genitourinary: No dysuria, no frequency, no urgency, no nocturia. Musculoskeletal:No arthralgias, no back pain, no gait disturbance or myalgias. Neurological: No dizziness, no headaches, no numbness, no seizures, no syncope, no weakness, no tremors. Hematologic: No lymphadenopathy, no easy bruising. Psychiatric: No confusion, no hallucinations, no sleep disturbance.    Physical Exam: Filed Vitals:   12/28/11 1018  BP: 122/78  Pulse: 66   the general appearance reveals a well-developed well-nourished woman in no distress.The head and neck exam reveals pupils equal and reactive.  Extraocular movements are full.  There is no scleral icterus.  The mouth and pharynx are normal.  The neck is supple.  The carotids reveal no bruits.  The jugular venous pressure is normal.  The  thyroid is not enlarged.  There is no lymphadenopathy.  The chest is clear to percussion and auscultation.  There are no rales or rhonchi.  Expansion of the chest is symmetrical.  The precordium is quiet.  The first heart sound is normal.  The second heart sound is  physiologically split.  There is no murmur gallop rub or click.  There is no abnormal lift or heave.  The abdomen is soft and nontender.  The bowel sounds are normal.  The liver and spleen are not enlarged.  There are no abdominal masses.  There are no abdominal bruits.  Extremities reveal good pedal pulses.  There is no phlebitis or edema.  There is no cyanosis or clubbing.  Strength is normal and symmetrical in all extremities.  There is no lateralizing weakness.  There are no sensory deficits.  The skin is warm and dry.  There is no rash.     Assessment / Plan: Continue same medication.  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  No limitations.  She will recheck here in 3 months for followup office visit

## 2011-12-28 NOTE — Patient Instructions (Addendum)
Ok to drive  Your physician recommends that you continue on your current medications as directed. Please refer to the Current Medication list given to you today.  Your physician recommends that you schedule a follow-up appointment in: 3 months

## 2011-12-28 NOTE — Assessment & Plan Note (Signed)
When last seen in the office her blood pressure was still running high and her lisinopril HCT was increased to 2 a day.  Since then her blood pressure has come down to normal.  She has been feeling well.

## 2012-01-15 ENCOUNTER — Other Ambulatory Visit (HOSPITAL_COMMUNITY): Payer: Self-pay | Admitting: Physician Assistant

## 2012-01-17 ENCOUNTER — Other Ambulatory Visit (HOSPITAL_COMMUNITY): Payer: Self-pay | Admitting: Physician Assistant

## 2012-01-17 ENCOUNTER — Ambulatory Visit (HOSPITAL_COMMUNITY)
Admission: RE | Admit: 2012-01-17 | Discharge: 2012-01-17 | Disposition: A | Discharge status: Home / Self Care | Payer: Self-pay | Source: Ambulatory Visit | Attending: Physician Assistant | Admitting: Physician Assistant

## 2012-01-17 DIAGNOSIS — R229 Localized swelling, mass and lump, unspecified: Secondary | ICD-10-CM | POA: Insufficient documentation

## 2012-01-17 DIAGNOSIS — R9389 Abnormal findings on diagnostic imaging of other specified body structures: Secondary | ICD-10-CM | POA: Insufficient documentation

## 2012-02-08 ENCOUNTER — Telehealth: Payer: Self-pay | Admitting: Cardiology

## 2012-02-08 NOTE — Telephone Encounter (Signed)
[id number]

## 2012-02-08 NOTE — Telephone Encounter (Signed)
**Note De-Identified Karina [name] Obfuscation** Pt. states that she needs letter mailed to [facility] by end of September. Please call pt. If  more info is needed.Marland Kitchen/LV

## 2012-02-08 NOTE — Telephone Encounter (Signed)
Per pt needs letter stating why she blacked out to [facility] hearing section medical review unit  [address] [place] [address], [phone] customer number

## 2012-02-08 NOTE — Telephone Encounter (Signed)
Follow-up:    Patient returned your call.  Please call back. 

## 2012-02-19 ENCOUNTER — Encounter: Payer: Self-pay | Admitting: *Deleted

## 2012-02-19 NOTE — Telephone Encounter (Signed)
mailed

## 2012-03-06 ENCOUNTER — Telehealth: Payer: Self-pay | Admitting: Cardiology

## 2012-03-06 NOTE — Telephone Encounter (Signed)
Spoke with pt. She states she has received a letter from [facility] dated February 23, 2012 that she needs to turn in her driver's license.  She is asking if letter from Dr. Patty Sermons was sent to [facility]. I told her it was sent February 19, 2012.  She is asking if I could fax letter to [facility] today.  I told her I would fax it this AM.  Fax number is [phone].  Attention: Reita D. Smolka.

## 2012-03-06 NOTE — Telephone Encounter (Signed)
plz return call to pt [phone] or [phone] regarding DMV letter from earlier this year.

## 2012-04-02 ENCOUNTER — Ambulatory Visit (INDEPENDENT_AMBULATORY_CARE_PROVIDER_SITE_OTHER): Payer: Self-pay | Admitting: Cardiology

## 2012-04-02 ENCOUNTER — Encounter: Payer: Self-pay | Admitting: Cardiology

## 2012-04-02 VITALS — BP 154/87 | HR 73 | Ht 62.4 in | Wt 199.0 lb

## 2012-04-02 DIAGNOSIS — I10 Essential (primary) hypertension: Secondary | ICD-10-CM

## 2012-04-02 DIAGNOSIS — F329 Major depressive disorder, single episode, unspecified: Secondary | ICD-10-CM

## 2012-04-02 DIAGNOSIS — R55 Syncope and collapse: Secondary | ICD-10-CM

## 2012-04-02 DIAGNOSIS — I119 Hypertensive heart disease without heart failure: Secondary | ICD-10-CM

## 2012-04-02 NOTE — Assessment & Plan Note (Signed)
Her depression is better.  She is in the process of job hunting and has been taking classes to prepare herself for reentering the workplace.

## 2012-04-02 NOTE — Progress Notes (Signed)
Kathi Simpers Date of Birth:  07-Mar-1951 [facility] [address] [place], [place]  [address] [phone]  Fax   [phone]  HPI: This pleasant 61 year old woman is seen back for a followup office visit. She has a past history of essential hypertension. On Oct 04, 2011 she had an episode of syncope. She was hospitalized overnight at [facility]. Telemetry was unremarkable. She was discharged the next day. She had a subsequent stress echocardiogram in our office which showed no ischemia and her ejection fraction was normal. She also wore a three-week event monitor which showed no arrhythmias. Since May 16 she has had no recurrent symptoms of dizziness or syncope. She's having no chest pain or shortness of breath. She's had no awareness of palpitations.   Current Outpatient Prescriptions  Medication Sig Dispense Refill  . albuterol (PROVENTIL HFA;VENTOLIN HFA) 108 (90 BASE) MCG/ACT inhaler Inhale 2 puffs into the lungs every 6 (six) hours as needed. For wheezing associated with allergies.      Marland Kitchen aspirin EC 81 MG tablet Take 81 mg by mouth daily.      . citalopram (CELEXA) 20 MG tablet Take 20 mg by mouth daily.      Marland Kitchen lisinopril-hydrochlorothiazide (PRINZIDE,ZESTORETIC) 20-12.5 MG per tablet Take 2 tablets by mouth daily.  180 tablet  3  . metoprolol (LOPRESSOR) 100 MG tablet Take 100 mg by mouth 2 (two) times daily.      . Multiple Vitamin (MULITIVITAMIN WITH MINERALS) TABS Take 1 tablet by mouth daily.      Marland Kitchen omega-3 acid ethyl esters (LOVAZA) 1 G capsule Take 2 g by mouth 2 (two) times daily.      . Pravastatin Sodium (PRAVACHOL PO) Take 20 mg by mouth daily.         Allergies  Allergen Reactions  . Penicillins Rash  . Sulfa Antibiotics Rash    Patient Active Problem List  Diagnosis  . Hypertension  . Syncope  . History of cervical cancer  . Genuine stress incontinence, female  . Depression    History  Smoking status  . Never Smoker     Smokeless tobacco  . Never Used    History  Alcohol Use  . Yes    Comment: 10/04/11 "mixed drinks <2 X/yr"    Family History  Problem Relation Age of Onset  . Diabetes type II Father   . Coronary artery disease Father   . Hypertension Father   . Hyperlipidemia Father   . Breast cancer Maternal Grandmother   . Breast cancer Maternal Aunt     2 maternal aunts  . Diabetes type II Mother   . Hypertension Mother     Review of Systems: The patient denies any heat or cold intolerance.  No weight gain or weight loss.  The patient denies headaches or blurry vision.  There is no cough or sputum production.  The patient denies dizziness.  There is no hematuria or hematochezia.  The patient denies any muscle aches or arthritis.  The patient denies any rash.  The patient denies frequent falling or instability.  There is no history of depression or anxiety.  All other systems were reviewed and are negative.   Physical Exam: Filed Vitals:   04/02/12 1016  BP: 154/87  Pulse: 73   the general appearance reveals a well-developed well-nourished woman in no distress.The head and neck exam reveals pupils equal and reactive.  Extraocular movements are full.  There is no scleral icterus.  The  mouth and pharynx are normal.  The neck is supple.  The carotids reveal no bruits.  The jugular venous pressure is normal.  The  thyroid is not enlarged.  There is no lymphadenopathy.  The chest is clear to percussion and auscultation.  There are no rales or rhonchi.  Expansion of the chest is symmetrical.  The precordium is quiet.  The first heart sound is normal.  The second heart sound is physiologically split.  There is no murmur gallop rub or click.  There is no abnormal lift or heave.  The abdomen is soft and nontender.  The bowel sounds are normal.  The liver and spleen are not enlarged.  There are no abdominal masses.  There are no abdominal bruits.  Extremities reveal good pedal pulses.  There is no phlebitis  or edema.  There is no cyanosis or clubbing.  Strength is normal and symmetrical in all extremities.  There is no lateralizing weakness.  There are no sensory deficits.  The skin is warm and dry.  There is no rash.      Assessment / Plan: Continue same medication and check in 6 months for followup office visit and EKG.  Try to lose weight

## 2012-04-02 NOTE — Patient Instructions (Addendum)
Your physician wants you to follow-up in: 6 months  You will receive a reminder letter in the mail two months in advance. If you don't receive a letter, please call our office to schedule the follow-up appointment.  Your physician recommends that you continue on your current medications as directed. Please refer to the Current Medication list given to you today.  

## 2012-04-02 NOTE — Assessment & Plan Note (Signed)
Blood pressure generally at home has been in the 120/80 range.  A here in the office it was elevated which she attributes to having taken her blood pressure pills late just before driving here.

## 2012-04-02 NOTE — Assessment & Plan Note (Signed)
The patient has had no further episodes of syncope since May 16.  We have allowed her to drive her car again.  Her syncope workup was entirely normal including stress echo and 3 week event monitor.  She feels well.  Denies dizziness or palpitations

## 2012-04-21 ENCOUNTER — Other Ambulatory Visit (HOSPITAL_COMMUNITY): Payer: Self-pay | Admitting: Physician Assistant

## 2012-04-21 DIAGNOSIS — Z139 Encounter for screening, unspecified: Secondary | ICD-10-CM

## 2012-04-25 ENCOUNTER — Emergency Department (HOSPITAL_COMMUNITY)
Admission: EM | Admit: 2012-04-25 | Discharge: 2012-04-25 | Disposition: A | Discharge status: Home / Self Care | Payer: Self-pay | Attending: Emergency Medicine | Admitting: Emergency Medicine

## 2012-04-25 ENCOUNTER — Ambulatory Visit (HOSPITAL_COMMUNITY)
Admission: RE | Admit: 2012-04-25 | Discharge: 2012-04-25 | Disposition: A | Discharge status: Home / Self Care | Payer: Self-pay | Source: Ambulatory Visit | Attending: Physician Assistant | Admitting: Physician Assistant

## 2012-04-25 ENCOUNTER — Encounter (HOSPITAL_COMMUNITY): Payer: Self-pay | Admitting: *Deleted

## 2012-04-25 DIAGNOSIS — Z8701 Personal history of pneumonia (recurrent): Secondary | ICD-10-CM | POA: Insufficient documentation

## 2012-04-25 DIAGNOSIS — F988 Other specified behavioral and emotional disorders with onset usually occurring in childhood and adolescence: Secondary | ICD-10-CM | POA: Insufficient documentation

## 2012-04-25 DIAGNOSIS — Z87442 Personal history of urinary calculi: Secondary | ICD-10-CM | POA: Insufficient documentation

## 2012-04-25 DIAGNOSIS — I10 Essential (primary) hypertension: Secondary | ICD-10-CM | POA: Insufficient documentation

## 2012-04-25 DIAGNOSIS — F411 Generalized anxiety disorder: Secondary | ICD-10-CM | POA: Insufficient documentation

## 2012-04-25 DIAGNOSIS — M129 Arthropathy, unspecified: Secondary | ICD-10-CM | POA: Insufficient documentation

## 2012-04-25 DIAGNOSIS — Z8742 Personal history of other diseases of the female genital tract: Secondary | ICD-10-CM | POA: Insufficient documentation

## 2012-04-25 DIAGNOSIS — Z139 Encounter for screening, unspecified: Secondary | ICD-10-CM

## 2012-04-25 DIAGNOSIS — Z8541 Personal history of malignant neoplasm of cervix uteri: Secondary | ICD-10-CM | POA: Insufficient documentation

## 2012-04-25 DIAGNOSIS — F3289 Other specified depressive episodes: Secondary | ICD-10-CM | POA: Insufficient documentation

## 2012-04-25 DIAGNOSIS — F329 Major depressive disorder, single episode, unspecified: Secondary | ICD-10-CM | POA: Insufficient documentation

## 2012-04-25 DIAGNOSIS — Y929 Unspecified place or not applicable: Secondary | ICD-10-CM | POA: Insufficient documentation

## 2012-04-25 DIAGNOSIS — Z79899 Other long term (current) drug therapy: Secondary | ICD-10-CM | POA: Insufficient documentation

## 2012-04-25 DIAGNOSIS — S30860A Insect bite (nonvenomous) of lower back and pelvis, initial encounter: Secondary | ICD-10-CM | POA: Insufficient documentation

## 2012-04-25 DIAGNOSIS — Z8719 Personal history of other diseases of the digestive system: Secondary | ICD-10-CM | POA: Insufficient documentation

## 2012-04-25 DIAGNOSIS — Y93H1 Activity, digging, shoveling and raking: Secondary | ICD-10-CM | POA: Insufficient documentation

## 2012-04-25 DIAGNOSIS — W57XXXA Bitten or stung by nonvenomous insect and other nonvenomous arthropods, initial encounter: Secondary | ICD-10-CM | POA: Insufficient documentation

## 2012-04-25 MED ORDER — BACITRACIN ZINC 500 UNIT/GM EX OINT
1.0000 "application " | TOPICAL_OINTMENT | Freq: Two times a day (BID) | CUTANEOUS | Status: DC
  Administered 2012-04-25: 1 via TOPICAL
  Filled 2012-04-25: qty 0.9

## 2012-04-25 MED ORDER — DOXYCYCLINE HYCLATE 100 MG PO CAPS: 100.0000 mg | ORAL_CAPSULE | Freq: Two times a day (BID) | ORAL | Status: DC

## 2012-04-25 MED ORDER — DOXYCYCLINE HYCLATE 100 MG PO TABS
100.0000 mg | ORAL_TABLET | Freq: Once | ORAL | Status: AC
  Administered 2012-04-25: 100 mg via ORAL
  Filled 2012-04-25: qty 1

## 2012-04-25 NOTE — ED Provider Notes (Signed)
History     CSN: [id number]  Arrival date & time 04/25/12  1111   First MD Initiated Contact with Patient 04/25/12 1214      Chief Complaint  Patient presents with  . Tick Removal    (Consider location/radiation/quality/duration/timing/severity/associated sxs/prior treatment) HPI Comments: Pt was raking leaves yest.  Went for mammogram today and tick noted on L breast.  It was removed but pt is concerned the "head is still in the skin".  The history is provided by the patient. No language interpreter was used.    Past Medical History  Diagnosis Date  . Hypertension   . Genuine stress incontinence, female 10/04/2011    S/P Burch procedure in 2001.  Following vaginal hysterectomy     . H/O seasonal allergies   . Pneumonia 1990's  . GERD (gastroesophageal reflux disease)   . Arthritis     "hands, shoulders, knees"  . Depression   . Anxiety   . ADD (attention deficit disorder)   . Syncope and collapse     a. 09/2011 Echo:  EF 60-65%, Gr 1 DD  . Borderline diabetes     a. A1c 6.7 09/2011  . Syncope May 2013    negative stress echo; has monitor in place  . Cervical cancer 1995    Treated with vaginal hysterectomy in 1995.   Marland Kitchen Kidney stones     Past Surgical History  Procedure Date  . Vaginal hysterectomy 1995  . Cesarean section 1984  . Breast biopsy 1990's    "needle; don't remember which side"    Family History  Problem Relation Age of Onset  . Diabetes type II Father   . Coronary artery disease Father   . Hypertension Father   . Hyperlipidemia Father   . Breast cancer Maternal Grandmother   . Breast cancer Maternal Aunt     2 maternal aunts  . Diabetes type II Mother   . Hypertension Mother     History  Substance Use Topics  . Smoking status: Never Smoker   . Smokeless tobacco: Never Used  . Alcohol Use: Yes     Comment: 10/04/11 "mixed drinks <2 X/yr"    OB History    Grav Para Term Preterm Abortions TAB SAB Ect Mult Living                   Review of Systems  Constitutional: Negative for fever and chills.  Skin: Positive for wound.  All other systems reviewed and are negative.    Allergies  Penicillins and Sulfa antibiotics  Home Medications   Current Outpatient Rx  Name  Route  Sig  Dispense  Refill  . ALBUTEROL SULFATE HFA 108 (90 BASE) MCG/ACT IN AERS   Inhalation   Inhale 2 puffs into the lungs every 6 (six) hours as needed. For wheezing associated with allergies.         . ASPIRIN EC 81 MG PO TBEC   Oral   Take 81 mg by mouth daily.         Marland Kitchen CITALOPRAM HYDROBROMIDE 20 MG PO TABS   Oral   Take 20 mg by mouth daily.         Marland Kitchen FLUTICASONE PROPIONATE 50 MCG/ACT NA SUSP   Nasal   Place 2 sprays into the nose daily as needed. Nasal allergies.         Marland Kitchen LISINOPRIL-HYDROCHLOROTHIAZIDE 20-12.5 MG PO TABS   Oral   Take 2 tablets by mouth daily.   180  tablet   3   . METOPROLOL TARTRATE 100 MG PO TABS   Oral   Take 100 mg by mouth 2 (two) times daily.         . ADULT MULTIVITAMIN W/MINERALS CH   Oral   Take 1 tablet by mouth daily.         . OMEGA-3-ACID ETHYL ESTERS 1 G PO CAPS   Oral   Take 2 g by mouth 2 (two) times daily.         Marland Kitchen PRAVACHOL PO   Oral   Take 20 mg by mouth daily.          Marland Kitchen DOXYCYCLINE HYCLATE 100 MG PO CAPS   Oral   Take 1 capsule (100 mg total) by mouth 2 (two) times daily.   20 capsule   0     BP 135/80  Pulse 61  Temp 98.6 F (37 C) (Oral)  Resp 20  Ht 5\' 2"  (1.575 m)  Wt 198 lb (89.812 kg)  BMI 36.21 kg/m2  SpO2 97%  Physical Exam  Nursing note and vitals reviewed. Constitutional: She is oriented to person, place, and time. She appears well-developed and well-nourished. No distress.  HENT:  Head: Normocephalic and atraumatic.  Eyes: EOM are normal.  Neck: Normal range of motion.  Cardiovascular: Normal rate, regular rhythm and normal heart sounds.   Pulmonary/Chest: Effort normal and breath sounds normal.    Abdominal: Soft. She  exhibits no distension. There is no tenderness.  Musculoskeletal: Normal range of motion.  Neurological: She is alert and oriented to person, place, and time.  Skin: Skin is warm and dry.  Psychiatric: She has a normal mood and affect. Judgment normal.    ED Course  Procedures (including critical care time)  Labs Reviewed - No data to display No results found.   1. Tick bite       MDM  rx-doxycycline, 20 Bacitracin dressing [facility].        Evalina Field, [place] 04/25/12 1314

## 2012-04-25 NOTE — ED Notes (Addendum)
Pt has tick attached to breast, noticed today when having a mammogram.  Lt breast

## 2012-04-25 NOTE — ED Provider Notes (Signed)
Medical screening examination/treatment/procedure(s) were performed by non-physician practitioner and as supervising physician I was immediately available for consultation/collaboration.  Somnang Mahan, MD 04/25/12 1438 

## 2012-12-22 ENCOUNTER — Ambulatory Visit: Payer: Self-pay | Admitting: Nurse Practitioner

## 2012-12-22 ENCOUNTER — Encounter: Payer: Self-pay | Admitting: Nurse Practitioner

## 2012-12-22 VITALS — BP 132/82 | HR 85 | Temp 99.1°F | Ht 61.0 in | Wt 205.0 lb

## 2012-12-22 DIAGNOSIS — J309 Allergic rhinitis, unspecified: Secondary | ICD-10-CM

## 2012-12-22 DIAGNOSIS — E785 Hyperlipidemia, unspecified: Secondary | ICD-10-CM

## 2012-12-22 DIAGNOSIS — I10 Essential (primary) hypertension: Secondary | ICD-10-CM

## 2012-12-22 DIAGNOSIS — F909 Attention-deficit hyperactivity disorder, unspecified type: Secondary | ICD-10-CM

## 2012-12-22 DIAGNOSIS — F329 Major depressive disorder, single episode, unspecified: Secondary | ICD-10-CM

## 2012-12-22 MED ORDER — ATOMOXETINE HCL 40 MG PO CAPS: 40.0000 mg | ORAL_CAPSULE | Freq: Every day | ORAL | Status: DC

## 2012-12-22 MED ORDER — FLUTICASONE PROPIONATE 50 MCG/ACT NA SUSP: 2.0000 | Freq: Every day | NASAL | Status: DC | PRN

## 2012-12-22 NOTE — Progress Notes (Signed)
Subjective:    Patient ID: Karina [name], female    DOB: 05-Jun-1950, 62 y.o.   MRN: [id number]  Hypertension This is a chronic problem. The current episode started more than 1 year ago. The problem is unchanged. The problem is uncontrolled. Pertinent negatives include no blurred vision, chest pain, headaches, malaise/fatigue, palpitations, peripheral edema or sweats. There are no associated agents to hypertension. Risk factors for coronary artery disease include dyslipidemia, obesity, post-menopausal state and sedentary lifestyle. Past treatments include ACE inhibitors and diuretics. The current treatment provides mild improvement. There are no compliance problems.   Hyperlipidemia This is a chronic problem. The current episode started more than 1 year ago. The problem is controlled. Recent lipid tests were reviewed and are normal. Exacerbating diseases include obesity. She has no history of diabetes or hypothyroidism. There are no known factors aggravating her hyperlipidemia. Pertinent negatives include no chest pain, focal sensory loss, focal weakness, leg pain or myalgias. Current antihyperlipidemic treatment includes statins. The current treatment provides mild improvement of lipids. There are no compliance problems.  Risk factors for coronary artery disease include dyslipidemia, obesity and post-menopausal.  ADHD Was diagnosed several years ago- was on strattera which worked well- needs to go back on it since she has started her new job. Allergic rhinitis  has an albuterol inhaler when allergies are bad but hasn't needed in a long time- flonase works well in combination with OTC zyrtec. Depression celexa working well- only thing she can afford right now anyway.   Review of Systems  Constitutional: Negative for malaise/fatigue.  Eyes: Negative for blurred vision.  Cardiovascular: Negative for chest pain and palpitations.  Musculoskeletal: Negative for myalgias.  Neurological: Negative  for focal weakness and headaches.  All other systems reviewed and are negative.       Objective:   Physical Exam  Constitutional: She is oriented to person, place, and time. She appears well-developed and well-nourished.  HENT:  Nose: Nose normal.  Mouth/Throat: Oropharynx is clear and moist.  Eyes: EOM are normal.  Neck: Trachea normal, normal range of motion and full passive range of motion without pain. Neck supple. No JVD present. Carotid bruit is not present. No thyromegaly present.  Cardiovascular: Normal rate, regular rhythm, normal heart sounds and intact distal pulses.  Exam reveals no gallop and no friction rub.   No murmur heard. Pulmonary/Chest: Effort normal and breath sounds normal.  Abdominal: Soft. Bowel sounds are normal. She exhibits no distension and no mass. There is no tenderness.  Musculoskeletal: Normal range of motion.  Lymphadenopathy:    She has no cervical adenopathy.  Neurological: She is alert and oriented to person, place, and time. She has normal reflexes.  Skin: Skin is warm and dry.  Psychiatric: She has a normal mood and affect. Her behavior is normal. Judgment and thought content normal.    BP 132/82  Pulse 85  Temp(Src) 99.1 F (37.3 C) (Oral)  Ht 5\' 1"  (1.549 m)  Wt 205 lb (92.987 kg)  BMI 38.75 kg/m2      Assessment & Plan:   1. Hypertension   2. Hyperlipidemia   3. Allergic rhinitis   4. Depression   5. ADHD (attention deficit hyperactivity disorder)    Labs reviewed at appointment Meds ordered this encounter  Medications  . lovastatin (MEVACOR) 20 MG tablet    Sig: Take 20 mg by mouth at bedtime.  . fish oil-omega-3 fatty acids 1000 MG capsule    Sig: Take 2 g by mouth daily.  Marland Kitchen  fluticasone (FLONASE) 50 MCG/ACT nasal spray    Sig: Place 2 sprays into the nose daily as needed. Nasal allergies.    Dispense:  16 g    Refill:  5    Order Specific Question:  Supervising Provider    Answer:  Ernestina Penna [[id number]]  .  atomoxetine (STRATTERA) 40 MG capsule    Sig: Take 1 capsule (40 mg total) by mouth daily.    Dispense:  30 capsule    Refill:  0    Order Specific Question:  Supervising Provider    Answer:  Deborra Medina   Continue all meds Diet and exercise encouraged  Mary-Margaret Daphine Deutscher, FNP

## 2012-12-22 NOTE — Patient Instructions (Signed)

## 2013-02-20 ENCOUNTER — Telehealth: Payer: Self-pay | Admitting: Nurse Practitioner

## 2013-02-23 NOTE — Telephone Encounter (Signed)
Do not have any- need RX?

## 2013-02-23 NOTE — Telephone Encounter (Signed)
lmtcb and let is know

## 2013-02-25 ENCOUNTER — Telehealth: Payer: Self-pay | Admitting: Family Medicine

## 2013-02-25 MED ORDER — ATOMOXETINE HCL 40 MG PO CAPS: 40.0000 mg | ORAL_CAPSULE | Freq: Every day | ORAL | Status: DC

## 2013-02-25 NOTE — Telephone Encounter (Signed)
The strattera

## 2013-02-25 NOTE — Telephone Encounter (Signed)
What rx needs to be sent?

## 2013-02-25 NOTE — Telephone Encounter (Signed)
rx [facility] 

## 2013-02-25 NOTE — Telephone Encounter (Signed)
Needs the medication sent to [facility] in [facility]

## 2013-04-13 ENCOUNTER — Other Ambulatory Visit (HOSPITAL_COMMUNITY): Payer: Self-pay | Admitting: Physician Assistant

## 2013-04-13 DIAGNOSIS — Z139 Encounter for screening, unspecified: Secondary | ICD-10-CM

## 2013-04-27 ENCOUNTER — Ambulatory Visit (HOSPITAL_COMMUNITY)
Admission: RE | Admit: 2013-04-27 | Discharge: 2013-04-27 | Disposition: A | Discharge status: Home / Self Care | Payer: Self-pay | Source: Ambulatory Visit | Attending: Physician Assistant | Admitting: Physician Assistant

## 2013-04-27 DIAGNOSIS — Z139 Encounter for screening, unspecified: Secondary | ICD-10-CM

## 2013-09-09 IMAGING — CT CT HEAD W/O CM
1 of 2 series · 16 of 30 positions shown, 20 images · non-contrast
Comparison: None.

CLINICAL DATA: Syncope.  Altered mental status.

CT HEAD WITHOUT CONTRAST
TECHNIQUE: Contiguous axial images were obtained from the base of
the skull through the vertex without contrast.

[Series 3: head trauma 2.4 h60s · axial · 0.43mm/px · z∈[-138,-10]mm · 16 of 60 slices shown, 20 images]
[im 4/60  brain]
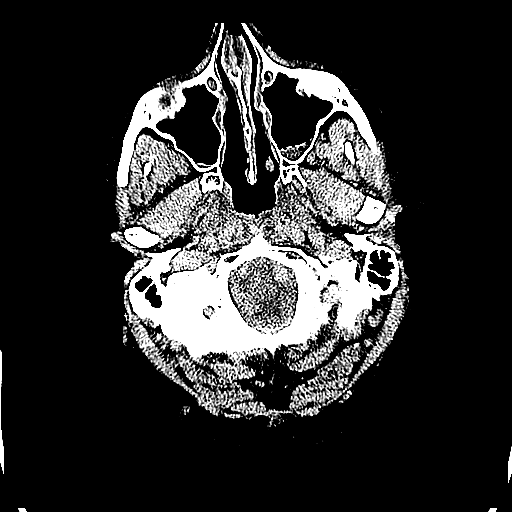
[im 4/60  bone]
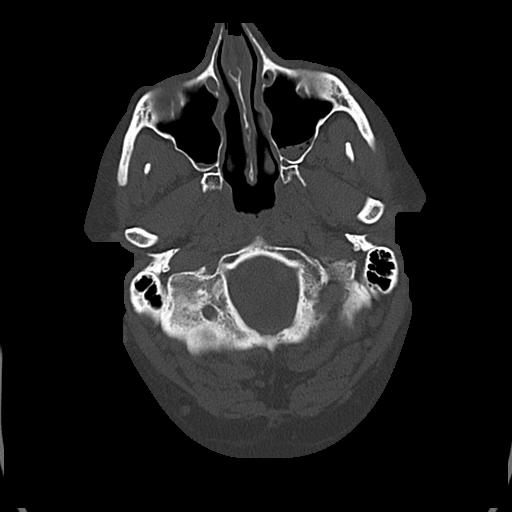
[im 7/60  brain]
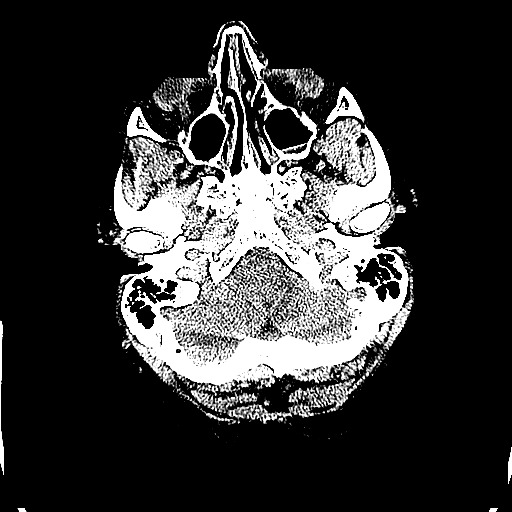
[im 10/60  brain]
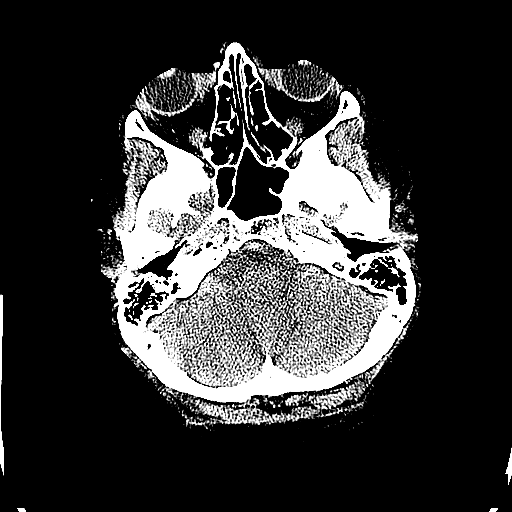
[im 13/60  brain]
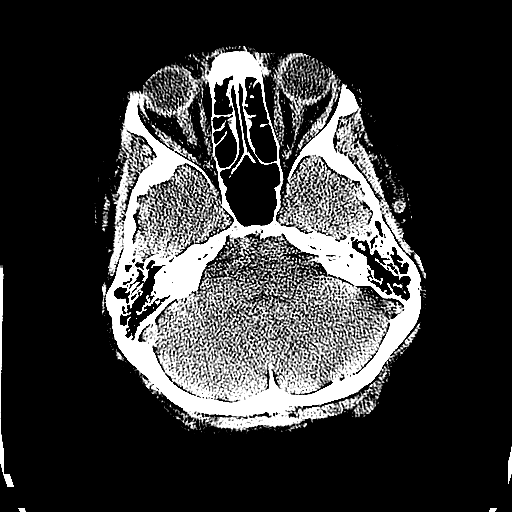
[im 19/60  brain]
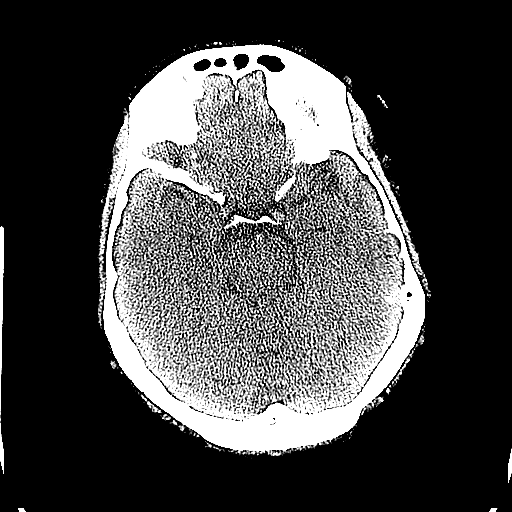
[im 19/60  bone]
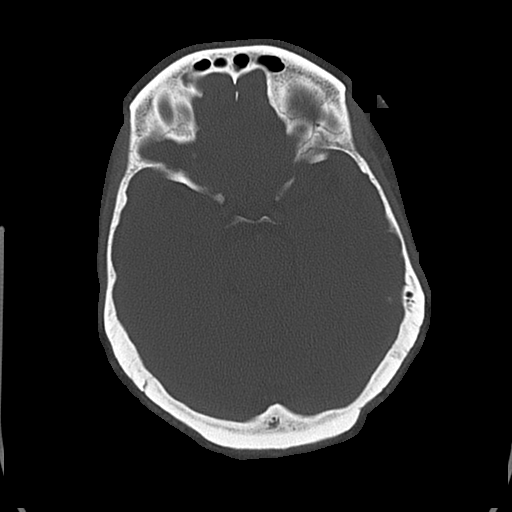
[im 22/60  brain]
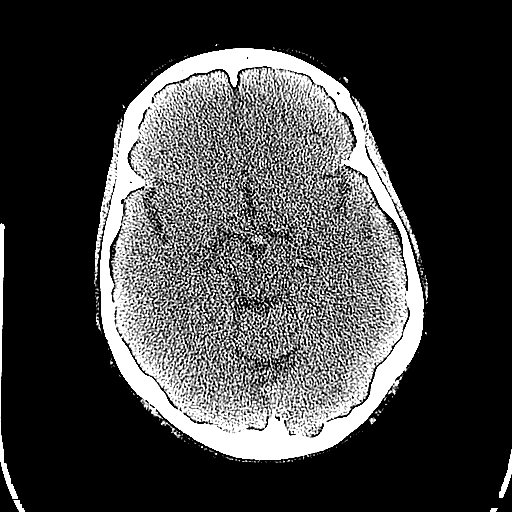
[im 25/60  brain]
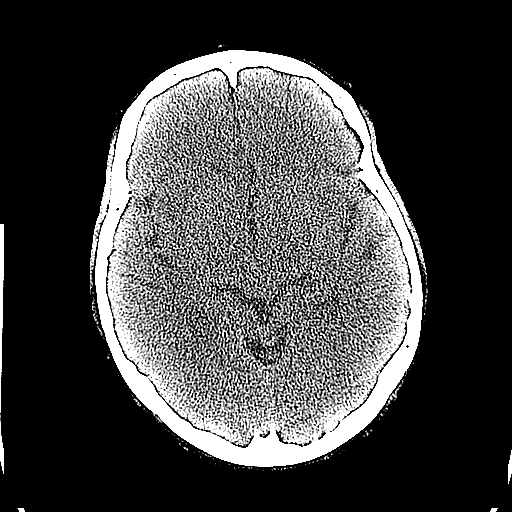
[im 28/60  brain]
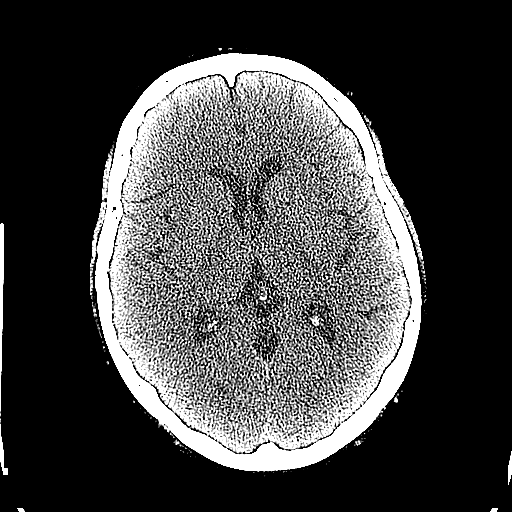
[im 32/60  brain]
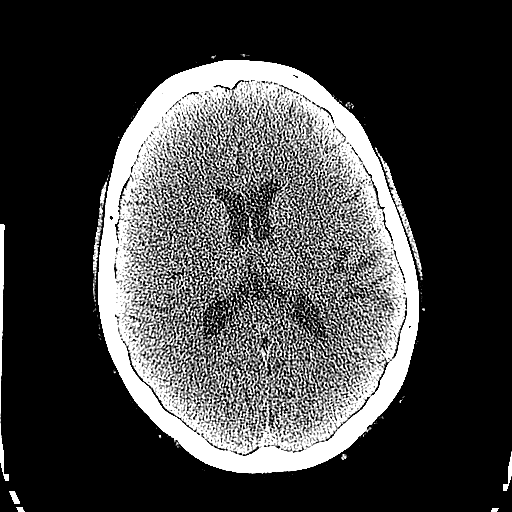
[im 32/60  bone]
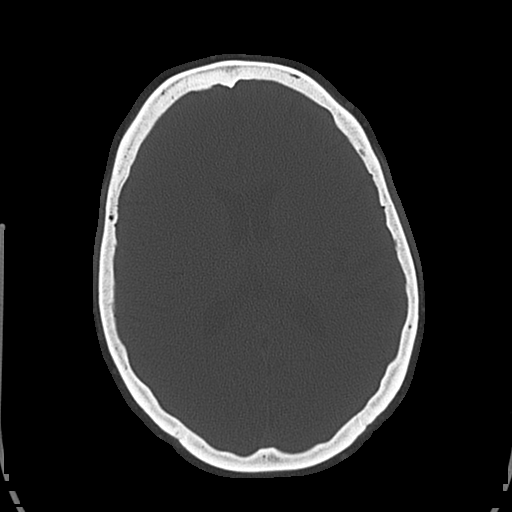
[im 35/60  brain]
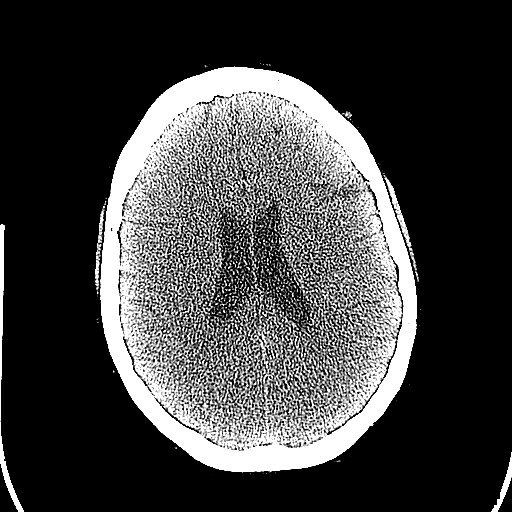
[im 38/60  brain]
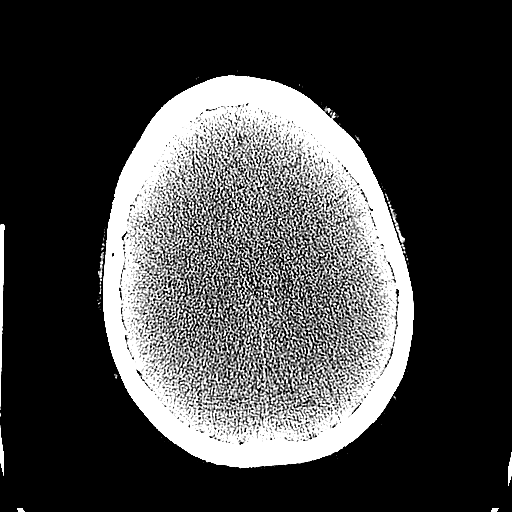
[im 41/60  brain]
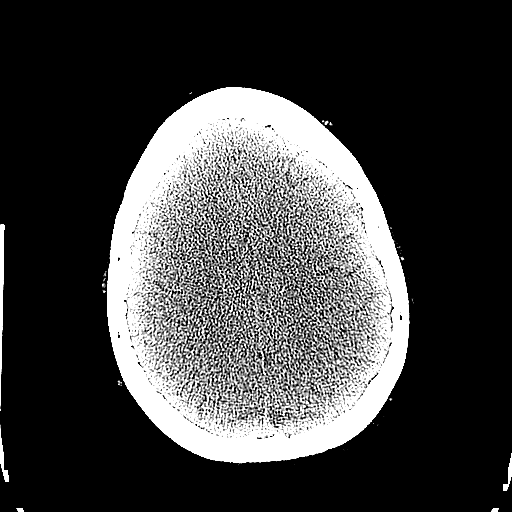
[im 47/60  brain]
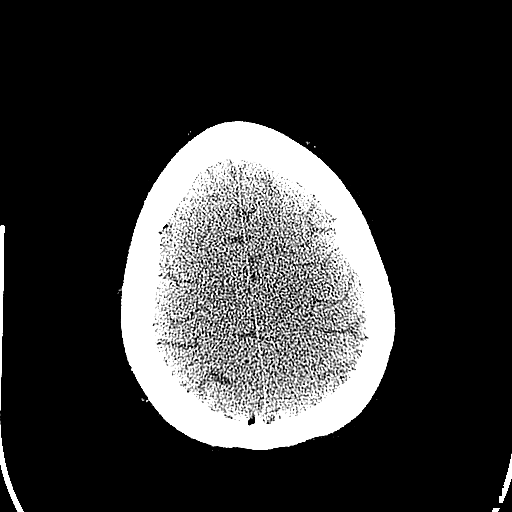
[im 47/60  bone]
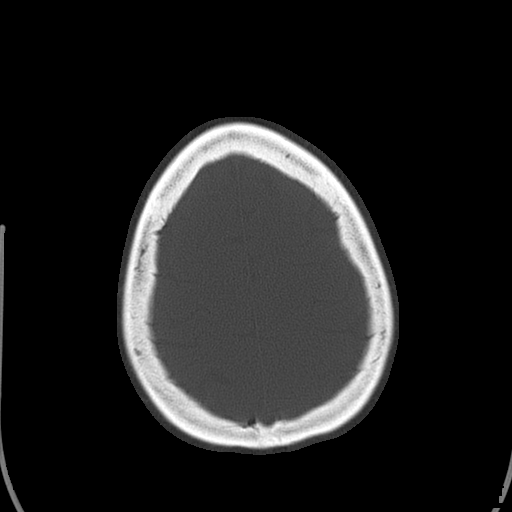
[im 50/60  brain]
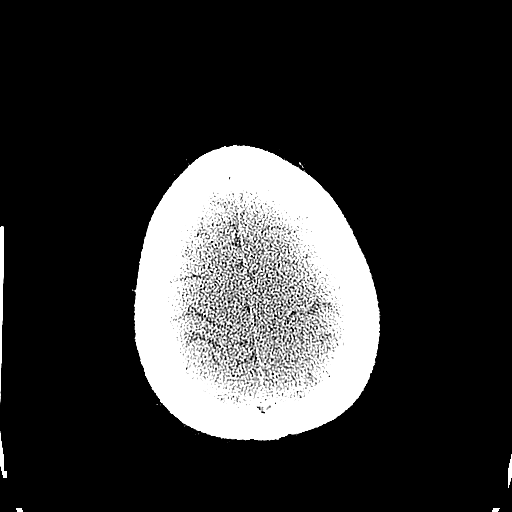
[im 53/60  brain]
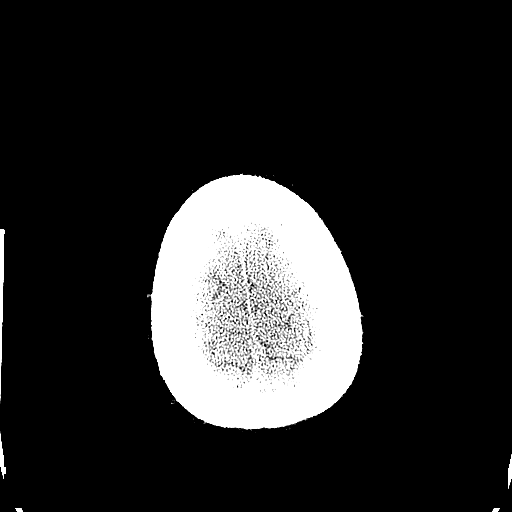
[im 56/60  brain]
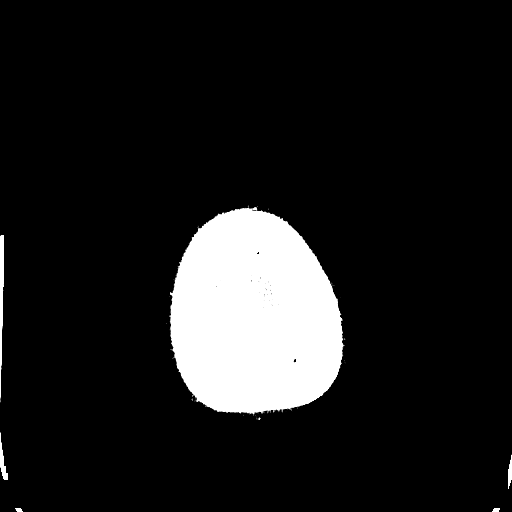

[16 of 30 positions shown; findings below may reference images not displayed]

FINDINGS: No acute intracranial abnormality.  Specifically, no
hemorrhage, hydrocephalus, mass lesion, acute infarction, or
significant intracranial injury.  No acute calvarial abnormality.

Frothy material within the left maxillary sinus.  Paranasal sinuses
and mastoids otherwise clear.
IMPRESSION: No acute intracranial abnormality.
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
## 2013-09-17 ENCOUNTER — Other Ambulatory Visit: Payer: Self-pay | Admitting: *Deleted

## 2013-09-17 MED ORDER — LISINOPRIL-HYDROCHLOROTHIAZIDE 20-12.5 MG PO TABS: 2.0000 | ORAL_TABLET | Freq: Every day | ORAL | Status: DC

## 2013-11-02 ENCOUNTER — Other Ambulatory Visit: Payer: Self-pay | Admitting: Nurse Practitioner

## 2013-11-04 ENCOUNTER — Ambulatory Visit: Payer: Self-pay | Admitting: Physician Assistant

## 2013-11-09 ENCOUNTER — Ambulatory Visit (INDEPENDENT_AMBULATORY_CARE_PROVIDER_SITE_OTHER): Payer: BC Managed Care – PPO | Admitting: Nurse Practitioner

## 2013-11-09 ENCOUNTER — Encounter: Payer: Self-pay | Admitting: Nurse Practitioner

## 2013-11-09 VITALS — BP 142/78 | HR 95 | Temp 99.2°F | Ht 61.0 in | Wt 200.0 lb

## 2013-11-09 DIAGNOSIS — F909 Attention-deficit hyperactivity disorder, unspecified type: Secondary | ICD-10-CM

## 2013-11-09 DIAGNOSIS — F329 Major depressive disorder, single episode, unspecified: Secondary | ICD-10-CM

## 2013-11-09 DIAGNOSIS — J3089 Other allergic rhinitis: Secondary | ICD-10-CM

## 2013-11-09 DIAGNOSIS — E785 Hyperlipidemia, unspecified: Secondary | ICD-10-CM

## 2013-11-09 DIAGNOSIS — J302 Other seasonal allergic rhinitis: Secondary | ICD-10-CM

## 2013-11-09 DIAGNOSIS — Z713 Dietary counseling and surveillance: Secondary | ICD-10-CM

## 2013-11-09 DIAGNOSIS — F32A Depression, unspecified: Secondary | ICD-10-CM

## 2013-11-09 DIAGNOSIS — I10 Essential (primary) hypertension: Secondary | ICD-10-CM

## 2013-11-09 DIAGNOSIS — F3289 Other specified depressive episodes: Secondary | ICD-10-CM

## 2013-11-09 DIAGNOSIS — Z6837 Body mass index (BMI) 37.0-37.9, adult: Secondary | ICD-10-CM

## 2013-11-09 MED ORDER — METOPROLOL TARTRATE 100 MG PO TABS: 100.0000 mg | ORAL_TABLET | Freq: Two times a day (BID) | ORAL | Status: DC

## 2013-11-09 MED ORDER — CITALOPRAM HYDROBROMIDE 20 MG PO TABS: 20.0000 mg | ORAL_TABLET | Freq: Every day | ORAL | Status: AC

## 2013-11-09 MED ORDER — FLUTICASONE PROPIONATE 50 MCG/ACT NA SUSP: 2.0000 | Freq: Every day | NASAL | Status: AC | PRN

## 2013-11-09 MED ORDER — LOVASTATIN 20 MG PO TABS: 20.0000 mg | ORAL_TABLET | Freq: Every day | ORAL | Status: DC

## 2013-11-09 MED ORDER — ATOMOXETINE HCL 40 MG PO CAPS: 40.0000 mg | ORAL_CAPSULE | Freq: Every day | ORAL | Status: AC

## 2013-11-09 MED ORDER — LISINOPRIL-HYDROCHLOROTHIAZIDE 20-12.5 MG PO TABS: 2.0000 | ORAL_TABLET | Freq: Every day | ORAL | Status: DC

## 2013-11-09 MED ORDER — LOVASTATIN 20 MG PO TABS: 20.0000 mg | ORAL_TABLET | Freq: Every day | ORAL | Status: AC

## 2013-11-09 NOTE — Progress Notes (Signed)
Subjective:    Patient ID: Karina [name], female    DOB: Sep 18, 1950, 63 y.o.   MRN: [id number]  Patient here today for follow up of chronic medical problems.  Hypertension This is a chronic problem. The current episode started more than 1 year ago. The problem is unchanged. The problem is uncontrolled. Pertinent negatives include no blurred vision, chest pain, headaches, malaise/fatigue, palpitations, peripheral edema or sweats. There are no associated agents to hypertension. Risk factors for coronary artery disease include dyslipidemia, obesity, post-menopausal state and sedentary lifestyle. Past treatments include ACE inhibitors and diuretics. The current treatment provides mild improvement. There are no compliance problems.   Hyperlipidemia This is a chronic problem. The current episode started more than 1 year ago. The problem is controlled. Recent lipid tests were reviewed and are normal. Exacerbating diseases include obesity. She has no history of diabetes or hypothyroidism. There are no known factors aggravating her hyperlipidemia. Pertinent negatives include no chest pain, focal sensory loss, focal weakness, leg pain or myalgias. Current antihyperlipidemic treatment includes statins. The current treatment provides mild improvement of lipids. There are no compliance problems.  Risk factors for coronary artery disease include dyslipidemia, obesity and post-menopausal.  ADHD Was diagnosed several years ago- was on strattera which worked well- needs to go back on it since she has started her new job. Allergic rhinitis  has an albuterol inhaler when allergies are bad but hasn't needed in a long time- flonase works well in combination with OTC zyrtec. Depression celexa working well- only thing she can afford right now anyway.   Review of Systems  Constitutional: Negative for malaise/fatigue.  Eyes: Negative for blurred vision.  Cardiovascular: Negative for chest pain and palpitations.   Musculoskeletal: Negative for myalgias.  Neurological: Negative for focal weakness and headaches.  All other systems reviewed and are negative.      Objective:   Physical Exam  Constitutional: She is oriented to person, place, and time. She appears well-developed and well-nourished.  HENT:  Nose: Nose normal.  Mouth/Throat: Oropharynx is clear and moist.  Eyes: EOM are normal.  Neck: Trachea normal, normal range of motion and full passive range of motion without pain. Neck supple. No JVD present. Carotid bruit is not present. No thyromegaly present.  Cardiovascular: Normal rate, regular rhythm, normal heart sounds and intact distal pulses.  Exam reveals no gallop and no friction rub.   No murmur heard. Pulmonary/Chest: Effort normal and breath sounds normal.  Abdominal: Soft. Bowel sounds are normal. She exhibits no distension and no mass. There is no tenderness.  Musculoskeletal: Normal range of motion.  Lymphadenopathy:    She has no cervical adenopathy.  Neurological: She is alert and oriented to person, place, and time. She has normal reflexes.  Skin: Skin is warm and dry.  Psychiatric: She has a normal mood and affect. Her behavior is normal. Judgment and thought content normal.    BP 142/78  Pulse 95  Temp(Src) 99.2 F (37.3 C) (Oral)  Ht 5' 1" (1.549 m)  Wt 200 lb (90.719 kg)  BMI 37.81 kg/m2      Assessment & Plan:   1. Essential hypertension   2. Depression   3. Adult ADHD   4. Other seasonal allergic rhinitis   5. Hyperlipidemia LDL goal <100   6. BMI 37.0-37.9, adult   7. Weight loss counseling, encounter for    Orders Placed This Encounter  Procedures  . CMP14+EGFR  . NMR, lipoprofile   Meds ordered this encounter  Medications  .  atomoxetine (STRATTERA) 40 MG capsule    Sig: Take 1 capsule (40 mg total) by mouth daily.    Dispense:  30 capsule    Refill:  2    Order Specific Question:  Supervising Provider    Answer:  Chipper Herb [[id number]]  .  citalopram (CELEXA) 20 MG tablet    Sig: Take 1 tablet (20 mg total) by mouth daily.    Dispense:  30 tablet    Refill:  5    Order Specific Question:  Supervising Provider    Answer:  Chipper Herb [[id number]]  . fluticasone (FLONASE) 50 MCG/ACT nasal spray    Sig: Place 2 sprays into both nostrils daily as needed. Nasal allergies.    Dispense:  16 g    Refill:  5    Order Specific Question:  Supervising Provider    Answer:  Chipper Herb [[id number]]  . lisinopril-hydrochlorothiazide (PRINZIDE,ZESTORETIC) 20-12.5 MG per tablet    Sig: Take 2 tablets by mouth daily.    Dispense:  60 tablet    Refill:  0    Order Specific Question:  Supervising Provider    Answer:  Chipper Herb [[id number]]  . metoprolol (LOPRESSOR) 100 MG tablet    Sig: Take 1 tablet (100 mg total) by mouth 2 (two) times daily.    Dispense:  30 tablet    Refill:  5    Order Specific Question:  Supervising Provider    Answer:  Chipper Herb [[id number]]  . lovastatin (MEVACOR) 20 MG tablet    Sig: Take 1 tablet (20 mg total) by mouth at bedtime.    Dispense:  30 tablet    Refill:  5    Order Specific Question:  Supervising Provider    Answer:  Chipper Herb [[id number]]    Labs pending Health maintenance reviewed Diet and exercise encouraged Continue all meds Follow up  In 3 months   [place], FNP

## 2013-11-10 ENCOUNTER — Telehealth: Payer: Self-pay | Admitting: Family Medicine

## 2013-11-10 LAB — CMP14+EGFR
ALT: 22 IU/L (ref 0–32)
AST: 16 IU/L (ref 0–40)
Albumin/Globulin Ratio: 2.3 (ref 1.1–2.5)
Albumin: 4.3 g/dL (ref 3.6–4.8)
Alkaline Phosphatase: 79 IU/L (ref 39–117)
BUN/Creatinine Ratio: 18 (ref 11–26)
BUN: 13 mg/dL (ref 8–27)
CALCIUM: 9.6 mg/dL (ref 8.7–10.3)
CHLORIDE: 104 mmol/L (ref 97–108)
CO2: 26 mmol/L (ref 18–29)
Creatinine, Ser: 0.71 mg/dL (ref 0.57–1.00)
GFR calc Af Amer: 106 mL/min/{1.73_m2} (ref >59)
GFR, EST NON AFRICAN AMERICAN: 92 mL/min/{1.73_m2} (ref >59)
GLUCOSE: 159 mg/dL — AB (ref 65–99)
Globulin, Total: 1.9 g/dL (ref 1.5–4.5)
POTASSIUM: 4 mmol/L (ref 3.5–5.2)
Sodium: 145 mmol/L — ABNORMAL HIGH (ref 134–144)
TOTAL PROTEIN: 6.2 g/dL (ref 6.0–8.5)
Total Bilirubin: 0.5 mg/dL (ref 0.0–1.2)

## 2013-11-10 LAB — NMR, LIPOPROFILE
Cholesterol: 178 mg/dL (ref 100–199)
HDL CHOLESTEROL BY NMR: 38 mg/dL — AB (ref >39)
HDL Particle Number: 30.4 umol/L — ABNORMAL LOW (ref >=30.5)
LDL PARTICLE NUMBER: 1243 nmol/L — AB (ref <1000)
LDL Size: 20.6 nm (ref >20.5)
LDLC SERPL CALC-MCNC: 99 mg/dL (ref 0–99)
LP-IR Score: 81 — ABNORMAL HIGH (ref <=45)
Small LDL Particle Number: 636 nmol/L — ABNORMAL HIGH (ref <=527)
TRIGLYCERIDES BY NMR: 207 mg/dL — AB (ref 0–149)

## 2013-11-10 NOTE — Telephone Encounter (Signed)
Message copied by Waverly Ferrari on Tue Nov 10, 2013  4:19 PM ------      Message from: Chevis Pretty      Created: Tue Nov 10, 2013 12:57 PM       Blood sugar elevated- add [id number]      Kidney and liver function stable      Cholesterol looks ok      Continue current meds- low fat diet and exercise and recheck in 3 months       ------

## 2013-11-17 LAB — POCT GLYCOSYLATED HEMOGLOBIN (HGB A1C): Hemoglobin A1C: 5.8

## 2013-11-17 NOTE — Addendum Note (Signed)
Addended by: Pollyann Kennedy F on: 11/17/2013 01:43 PM   Modules accepted: Orders

## 2013-11-25 ENCOUNTER — Other Ambulatory Visit: Payer: Self-pay | Admitting: Nurse Practitioner

## 2013-11-28 ENCOUNTER — Other Ambulatory Visit: Payer: Self-pay | Admitting: Nurse Practitioner

## 2013-11-28 DIAGNOSIS — I10 Essential (primary) hypertension: Secondary | ICD-10-CM

## 2013-11-28 MED ORDER — METOPROLOL TARTRATE 100 MG PO TABS: 100.0000 mg | ORAL_TABLET | Freq: Two times a day (BID) | ORAL | Status: DC

## 2014-01-12 ENCOUNTER — Other Ambulatory Visit: Payer: Self-pay | Admitting: Nurse Practitioner

## 2014-01-16 ENCOUNTER — Other Ambulatory Visit: Payer: Self-pay | Admitting: Nurse Practitioner

## 2014-01-18 ENCOUNTER — Telehealth: Payer: Self-pay | Admitting: Nurse Practitioner

## 2014-01-18 DIAGNOSIS — I10 Essential (primary) hypertension: Secondary | ICD-10-CM

## 2014-01-18 MED ORDER — LISINOPRIL-HYDROCHLOROTHIAZIDE 20-12.5 MG PO TABS: 2.0000 | ORAL_TABLET | Freq: Every day | ORAL | Status: AC

## 2014-01-18 MED ORDER — METOPROLOL TARTRATE 100 MG PO TABS: 100.0000 mg | ORAL_TABLET | Freq: Two times a day (BID) | ORAL | Status: AC

## 2014-01-18 NOTE — Telephone Encounter (Signed)
Called in new orders to [facility]

## 2014-01-19 NOTE — Telephone Encounter (Signed)
Tell her to contact pharmacy for request

## 2014-01-19 NOTE — Telephone Encounter (Signed)
Pt notified should still have refills.

## 2021-10-24 ENCOUNTER — Emergency Department (HOSPITAL_COMMUNITY): Payer: Medicare HMO

## 2021-10-24 ENCOUNTER — Other Ambulatory Visit: Payer: Self-pay

## 2021-10-24 ENCOUNTER — Emergency Department (HOSPITAL_COMMUNITY)
Admission: EM | Admit: 2021-10-24 | Discharge: 2021-10-24 | Disposition: A | Discharge status: Home / Self Care | Payer: Medicare HMO | Attending: Emergency Medicine | Admitting: Emergency Medicine

## 2021-10-24 ENCOUNTER — Encounter (HOSPITAL_COMMUNITY): Payer: Self-pay

## 2021-10-24 DIAGNOSIS — Z7982 Long term (current) use of aspirin: Secondary | ICD-10-CM | POA: Diagnosis not present

## 2021-10-24 DIAGNOSIS — W19XXXA Unspecified fall, initial encounter: Secondary | ICD-10-CM

## 2021-10-24 DIAGNOSIS — Z79899 Other long term (current) drug therapy: Secondary | ICD-10-CM | POA: Diagnosis not present

## 2021-10-24 DIAGNOSIS — Y9301 Activity, walking, marching and hiking: Secondary | ICD-10-CM | POA: Insufficient documentation

## 2021-10-24 DIAGNOSIS — R519 Headache, unspecified: Secondary | ICD-10-CM | POA: Diagnosis not present

## 2021-10-24 DIAGNOSIS — S0591XA Unspecified injury of right eye and orbit, initial encounter: Secondary | ICD-10-CM | POA: Diagnosis present

## 2021-10-24 DIAGNOSIS — S0231XA Fracture of orbital floor, right side, initial encounter for closed fracture: Secondary | ICD-10-CM

## 2021-10-24 DIAGNOSIS — W108XXA Fall (on) (from) other stairs and steps, initial encounter: Secondary | ICD-10-CM | POA: Insufficient documentation

## 2021-10-24 DIAGNOSIS — S02831A Fracture of medial orbital wall, right side, initial encounter for closed fracture: Secondary | ICD-10-CM | POA: Diagnosis not present

## 2021-10-24 LAB — CBC WITH DIFFERENTIAL/PLATELET
Abs Immature Granulocytes: 0.02 10*3/uL (ref 0.00–0.07)
Basophils Absolute: 0 10*3/uL (ref 0.0–0.1)
Basophils Relative: 1 %
Eosinophils Absolute: 0.3 10*3/uL (ref 0.0–0.5)
Eosinophils Relative: 3 %
HCT: 44.6 % (ref 36.0–46.0)
Hemoglobin: 14.2 g/dL (ref 12.0–15.0)
Immature Granulocytes: 0 %
Lymphocytes Relative: 37 %
Lymphs Abs: 3.1 10*3/uL (ref 0.7–4.0)
MCH: 29.3 pg (ref 26.0–34.0)
MCHC: 31.8 g/dL (ref 30.0–36.0)
MCV: 92.1 fL (ref 80.0–100.0)
Monocytes Absolute: 0.5 10*3/uL (ref 0.1–1.0)
Monocytes Relative: 6 %
Neutro Abs: 4.5 10*3/uL (ref 1.7–7.7)
Neutrophils Relative %: 53 %
Platelets: 203 10*3/uL (ref 150–400)
RBC: 4.84 MIL/uL (ref 3.87–5.11)
RDW: 13.2 % (ref 11.5–15.5)
WBC: 8.5 10*3/uL (ref 4.0–10.5)
nRBC: 0 % (ref 0.0–0.2)

## 2021-10-24 LAB — PROTIME-INR
INR: 1 (ref 0.8–1.2)
Prothrombin Time: 13 seconds (ref 11.4–15.2)

## 2021-10-24 LAB — BASIC METABOLIC PANEL
Anion gap: 11 (ref 5–15)
BUN: 11 mg/dL (ref 8–23)
CO2: 24 mmol/L (ref 22–32)
Calcium: 9.1 mg/dL (ref 8.9–10.3)
Chloride: 107 mmol/L (ref 98–111)
Creatinine, Ser: 0.88 mg/dL (ref 0.44–1.00)
GFR, Estimated: >60 mL/min (ref >60)
Glucose, Bld: 107 mg/dL — ABNORMAL HIGH (ref 70–99)
Potassium: 3.4 mmol/L — ABNORMAL LOW (ref 3.5–5.1)
Sodium: 142 mmol/L (ref 135–145)

## 2021-10-24 MED ORDER — OXYCODONE-ACETAMINOPHEN 5-325 MG PO TABS: 1.0000 | ORAL_TABLET | Freq: Four times a day (QID) | ORAL | 0 refills | Status: AC | PRN

## 2021-10-24 MED ORDER — FENTANYL CITRATE PF 50 MCG/ML IJ SOSY
50.0000 ug | PREFILLED_SYRINGE | Freq: Once | INTRAMUSCULAR | Status: AC
  Administered 2021-10-24: 50 ug via INTRAVENOUS
  Filled 2021-10-24: qty 1

## 2021-10-24 NOTE — Discharge Instructions (Addendum)
Please follow-up with ophthalmology.  Recommend no nose blowing, nasal saline as needed for congestion.  Percocet has been prescribed for pain control.  Your facial CT revealed the following: IMPRESSION:  1. Acute comminuted and up to 3 mm depressed right orbital floor  fracture.  2. Acute minimally medially displaced right nasal bone/nasal process  of the maxilla fracture. Acute right lamina papyracea fracture.  3. Associated right face small hematoma formation and marked  subcutaneus soft tissue emphysema.

## 2021-10-24 NOTE — ED Provider Notes (Signed)
[facility] EMERGENCY DEPARTMENT Provider Note   CSN: [id number] Arrival date & time: 10/24/21  1356     History  Chief Complaint  Patient presents with   Lytle Michaels    Karina [name] is a 71 y.o. female.   Fall   71 year old female presenting to the emergency department after a ground-level mechanical fall.  The patient states that she was walking down steps when she missed a step and fell flat forward onto her face.  She denies any loss of consciousness.  She landed on the right side of her face and sustained a [place] injury to the left wrist.  She has had some pain in the left wrist but primarily endorses pain in her face.  She denies any neck pain.  She is not on anticoagulation.  She denies any diplopia, pain with extraocular movements.  She arrived to the emergency department GCS 15, ABC intact.  Home Medications Prior to Admission medications   Medication Sig Start Date End Date Taking? Authorizing Provider  oxyCODONE-acetaminophen (PERCOCET/ROXICET) 5-325 MG tablet Take 1 tablet by mouth every 6 (six) hours as needed for severe pain. 10/24/21  Yes Regan Lemming, MD  albuterol (PROVENTIL HFA;VENTOLIN HFA) 108 (90 BASE) MCG/ACT inhaler Inhale 2 puffs into the lungs every 6 (six) hours as needed. For wheezing associated with allergies.    [provider]  aspirin EC 81 MG tablet Take 81 mg by mouth daily.    [provider]  atomoxetine (STRATTERA) 40 MG capsule Take 1 capsule (40 mg total) by mouth daily. 11/09/13   Hassell Done, Mary-Margaret, FNP  citalopram (CELEXA) 20 MG tablet Take 1 tablet (20 mg total) by mouth daily. 11/09/13   Hassell Done Mary-Margaret, FNP  fish oil-omega-3 fatty acids 1000 MG capsule Take 2 g by mouth daily.    [provider]  fluticasone (FLONASE) 50 MCG/ACT nasal spray Place 2 sprays into both nostrils daily as needed. Nasal allergies. 11/09/13   Hassell Done Mary-Margaret, FNP  lisinopril-hydrochlorothiazide  (PRINZIDE,ZESTORETIC) 20-12.5 MG per tablet Take 2 tablets by mouth daily. 01/18/14   Hassell Done, Mary-Margaret, FNP  lovastatin (MEVACOR) 20 MG tablet Take 1 tablet (20 mg total) by mouth at bedtime. 11/09/13   Hassell Done Mary-Margaret, FNP  metoprolol (LOPRESSOR) 100 MG tablet Take 1 tablet (100 mg total) by mouth 2 (two) times daily. 01/18/14   Hassell Done Mary-Margaret, FNP  Multiple Vitamin (MULITIVITAMIN WITH MINERALS) TABS Take 1 tablet by mouth daily.    [provider]      Allergies    Penicillins and Sulfa antibiotics    Review of Systems   Review of Systems  All other systems reviewed and are negative.  Physical Exam Updated Vital Signs BP (!) 169/107   Pulse 61   Temp 99.1 F (37.3 C) (Oral)   Resp 18   Ht '5\' 1"'$  (1.549 m)   Wt 86.2 kg   SpO2 100%   BMI 35.90 kg/m  Physical Exam Vitals and nursing note reviewed.  Constitutional:      General: She is not in acute distress.    Appearance: She is well-developed.     Comments: GCS 15, ABC intact  HENT:     Head: Normocephalic.  Eyes:     Extraocular Movements: Extraocular movements intact.     Conjunctiva/sclera: Conjunctivae normal.     Pupils: Pupils are equal, round, and reactive to light.     Comments: No pain with extraocular movements.  Extraocular movements intact.  Periorbital ecchymosis present on the  right with tenderness palpation inferiorly and medially around the orbit.  No signs of orbital entrapment, no proptosis  Neck:     Comments: No midline tenderness to palpation of the cervical spine.  Range of motion intact Cardiovascular:     Rate and Rhythm: Normal rate and regular rhythm.     Heart sounds: No murmur heard. Pulmonary:     Effort: Pulmonary effort is normal. No respiratory distress.     Breath sounds: Normal breath sounds.  Chest:     Comments: Clavicles stable nontender to AP compression.  Chest wall stable and nontender to AP and lateral compression. Abdominal:     Palpations: Abdomen is  soft.     Tenderness: There is no abdominal tenderness.  Musculoskeletal:     Cervical back: Neck supple.     Comments: No midline tenderness to palpation of the thoracic or lumbar spine.  Extremities atraumatic with intact range of motion, no anatomic snuffbox tenderness bilaterally.  Minimal tenderness of the left wrist, 2+ radial pulses, intact range of motion, intact motor function  Skin:    General: Skin is warm and dry.  Neurological:     Mental Status: She is alert.     Comments: Cranial nerves II through XII grossly intact.  Moving all 4 extremities spontaneously.  Sensation grossly intact all 4 extremities    ED Results / Procedures / Treatments   Labs (all labs ordered are listed, but only abnormal results are displayed) Labs Reviewed  BASIC METABOLIC PANEL - Abnormal; Notable for the following components:      Result Value   Potassium 3.4 (*)    Glucose, Bld 107 (*)    All other components within normal limits  CBC WITH DIFFERENTIAL/PLATELET  PROTIME-INR    EKG None  Radiology CT Maxillofacial Wo Contrast  Result Date: 10/24/2021 CLINICAL DATA:  Facial trauma, blunt EXAM: CT MAXILLOFACIAL WITHOUT CONTRAST TECHNIQUE: Multidetector CT imaging of the maxillofacial structures was performed. Multiplanar CT image reconstructions were also generated. RADIATION DOSE REDUCTION: This exam was performed according to the departmental dose-optimization program which includes automated exposure control, adjustment of the mA and/or kV according to patient size and/or use of iterative reconstruction technique. COMPARISON:  None Available. FINDINGS: Osseous: Acute minimally medially displaced right nasal bone/nasal process of the maxilla fracture (5:51). Acute comminuted and up to 3 mm depressed right orbital floor fracture. Acute right lamina papyracea chair fracture. No left facial fracture. Orbits: Negative. No traumatic or inflammatory finding. Sinuses: Clear. Soft tissues: Marked right  periorbital subcutaneus soft tissue edema and emphysema. Small hematoma formation (3:64). Subcutaneus soft tissue edema and emphysema extends along the right retro maxillary fat pad, submandibular space, parotid space. Limited intracranial: No significant or unexpected finding. IMPRESSION: 1. Acute comminuted and up to 3 mm depressed right orbital floor fracture. 2. Acute minimally medially displaced right nasal bone/nasal process of the maxilla fracture. Acute right lamina papyracea fracture. 3. Associated right face small hematoma formation and marked subcutaneus soft tissue emphysema. Electronically Signed   By: Iven Finn M.D.   On: 10/24/2021 16:46    Procedures Procedures    Medications Ordered in ED Medications  fentaNYL (SUBLIMAZE) injection 50 mcg (50 mcg Intravenous Given 10/24/21 1827)    ED Course/ Medical Decision Making/ A&P                           Medical Decision Making Amount and/or Complexity of Data Reviewed Labs: ordered.  Risk  Prescription drug management.   71 year old female presenting to the emergency department after a ground-level mechanical fall.  The patient states that she was walking down steps when she missed a step and fell flat forward onto her face.  She denies any loss of consciousness.  She landed on the right side of her face and sustained a [place] injury to the left wrist.  She has had some pain in the left wrist but primarily endorses pain in her face.  She denies any neck pain.  She is not on anticoagulation.  She denies any diplopia, pain with extraocular movements.  She arrived to the emergency department GCS 15, ABC intact.  On arrival, the patient was vitally stable.  Sinus rhythm noted on cardiac telemetry.  Presenting with a ground-level mechanical fall and facial trauma.  CT of the face concerning for an inferior and medial orbital wall fracture.    No evidence of nasal septal hematoma on exam. No signs of entrapment on physical exam.  The  patient had a left wrist without significant tenderness to palpation.  No anatomic snuffbox tenderness after a [place]. No other traumatic injuries identified on physical exam.   CT results: IMPRESSION:  1. Acute comminuted and up to 3 mm depressed right orbital floor  fracture.  2. Acute minimally medially displaced right nasal bone/nasal process  of the maxilla fracture. Acute right lamina papyracea fracture.  3. Associated right face small hematoma formation and marked  subcutaneus soft tissue emphysema.   I did speak with Dr. Stacie Glaze of on-call ophthalmology who recommended pain control and follow-up outpatient in clinic. No signs of orbital entrapment with a normal neurologic exam and no diplopia, no pain with extraocular movements.  The patient was prescribed Percocet for pain control.  She was advised no nose blowing and nasal saline as needed.    Final Clinical Impression(s) / ED Diagnoses Final diagnoses:  Fall, initial encounter  Closed fracture of medial wall of right orbit, initial encounter ([place])  Closed fracture of right orbital floor, initial encounter [facility])    Rx / DC Orders ED Discharge Orders          Ordered    oxyCODONE-acetaminophen (PERCOCET/ROXICET) 5-325 MG tablet  Every 6 hours PRN        10/24/21 2002    Ambulatory referral to Ophthalmology        10/24/21 2003              Regan Lemming, MD 10/24/21 2025

## 2021-10-24 NOTE — ED Provider Triage Note (Signed)
Emergency Medicine Provider Triage Evaluation Note  Karina [name] , a 71 y.o. female  was evaluated in triage.  Pt complains of right eye pain.  Patient states that she fell walking down the stairs and her flips flops.  She fell flat on her front side hitting her right face on the cement floor.  She went to hospital yesterday had an x-ray of her face done which is suggestive of right orbital fracture but recommended maxillofacial CT to confirm.  She denies pain with EOMs or changes in vision.  Denies blood thinner use.  Denies sensory/motor deficits, headache, dizziness, lightheadedness  Review of Systems  Positive: See above Negative:   Physical Exam  BP (!) 184/91 (BP Location: Right Arm)   Pulse 70   Temp 99.1 F (37.3 C) (Oral)   Resp (!) 22   Ht '5\' 1"'$  (1.549 m)   Wt 86.2 kg   SpO2 99%   BMI 35.90 kg/m  Gen:   Awake, no distress   Resp:  Normal effort  MSK:   Moves extremities without difficulty  Other:  Possible right hemotympanum.  No ecchymosis to mastoid.  Obvious ecchymosis to exterior right orbit.  No pain with EOMs or changes in vision.  Tenderness to palpation superior and inferior orbital bones.     Medical Decision Making  Medically screening exam initiated at 3:35 PM.  Appropriate orders placed.  Karina [name] was informed that the remainder of the evaluation will be completed by another provider, this initial triage assessment does not replace that evaluation, and the importance of remaining in the ED until their evaluation is complete.     Karina [name], [place] 10/24/21 [phone]

## 2021-10-24 NOTE — ED Triage Notes (Signed)
Pt arrived POV from home c/o a fall that happened yesterday. Pt states she tripped and fell down the stairs onto a cement carport. Pt states she was seen at [facility] yesterday and they told her to come here today that she had a facial fx and fluid to come for scans.
